# Patient Record
Sex: Female | Born: 1990 | Race: Black or African American | Hispanic: No | Marital: Single | Smoking: Never smoker
Health system: Southern US, Community
[De-identification: ages and names within clinical notes are randomized; demographics above are authoritative.]

## PROBLEM LIST (undated history)

## (undated) ENCOUNTER — Inpatient Hospital Stay (HOSPITAL_COMMUNITY): Payer: Self-pay

## (undated) DIAGNOSIS — R87629 Unspecified abnormal cytological findings in specimens from vagina: Secondary | ICD-10-CM

## (undated) DIAGNOSIS — A549 Gonococcal infection, unspecified: Secondary | ICD-10-CM

## (undated) HISTORY — PX: NO PAST SURGERIES: SHX2092

---

## 2012-07-07 ENCOUNTER — Encounter (HOSPITAL_BASED_OUTPATIENT_CLINIC_OR_DEPARTMENT_OTHER): Payer: Self-pay | Admitting: Emergency Medicine

## 2012-07-07 ENCOUNTER — Emergency Department (HOSPITAL_BASED_OUTPATIENT_CLINIC_OR_DEPARTMENT_OTHER)
Admission: EM | Admit: 2012-07-07 | Discharge: 2012-07-07 | Disposition: A | Discharge status: Home / Self Care | Payer: Self-pay | Attending: Emergency Medicine | Admitting: Emergency Medicine

## 2012-07-07 DIAGNOSIS — K2970 Gastritis, unspecified, without bleeding: Secondary | ICD-10-CM | POA: Insufficient documentation

## 2012-07-07 DIAGNOSIS — K2990 Gastroduodenitis, unspecified, without bleeding: Secondary | ICD-10-CM | POA: Insufficient documentation

## 2012-07-07 DIAGNOSIS — Z3202 Encounter for pregnancy test, result negative: Secondary | ICD-10-CM | POA: Insufficient documentation

## 2012-07-07 DIAGNOSIS — R1110 Vomiting, unspecified: Secondary | ICD-10-CM | POA: Insufficient documentation

## 2012-07-07 DIAGNOSIS — R197 Diarrhea, unspecified: Secondary | ICD-10-CM | POA: Insufficient documentation

## 2012-07-07 LAB — CBC WITH DIFFERENTIAL/PLATELET
Basophils Absolute: 0 10*3/uL (ref 0.0–0.1)
Eosinophils Absolute: 0.2 10*3/uL (ref 0.0–0.7)
Eosinophils Relative: 3 % (ref 0–5)
HCT: 37.2 % (ref 36.0–46.0)
Lymphocytes Relative: 24 % (ref 12–46)
MCH: 33.1 pg (ref 26.0–34.0)
MCHC: 35.5 g/dL (ref 30.0–36.0)
MCV: 93.2 fL (ref 78.0–100.0)
Monocytes Absolute: 0.7 10*3/uL (ref 0.1–1.0)
Platelets: 209 10*3/uL (ref 150–400)
RDW: 11.6 % (ref 11.5–15.5)
WBC: 8.1 10*3/uL (ref 4.0–10.5)

## 2012-07-07 LAB — COMPREHENSIVE METABOLIC PANEL
ALT: 8 U/L (ref 0–35)
AST: 15 U/L (ref 0–37)
Albumin: 4.3 g/dL (ref 3.5–5.2)
Alkaline Phosphatase: 79 U/L (ref 39–117)
BUN: 12 mg/dL (ref 6–23)
Chloride: 103 mEq/L (ref 96–112)
Potassium: 3.7 mEq/L (ref 3.5–5.1)
Sodium: 138 mEq/L (ref 135–145)
Total Bilirubin: 0.4 mg/dL (ref 0.3–1.2)
Total Protein: 8 g/dL (ref 6.0–8.3)

## 2012-07-07 LAB — URINALYSIS, ROUTINE W REFLEX MICROSCOPIC
Bilirubin Urine: NEGATIVE
Nitrite: NEGATIVE
Specific Gravity, Urine: 1.03 (ref 1.005–1.030)
Urobilinogen, UA: 1 mg/dL (ref 0.0–1.0)
pH: 8 (ref 5.0–8.0)

## 2012-07-07 LAB — PREGNANCY, URINE: Preg Test, Ur: NEGATIVE

## 2012-07-07 MED ORDER — ONDANSETRON HCL 4 MG/2ML IJ SOLN
4.0000 mg | Freq: Once | INTRAMUSCULAR | Status: AC
  Administered 2012-07-07: 4 mg via INTRAVENOUS
  Filled 2012-07-07: qty 2

## 2012-07-07 MED ORDER — ONDANSETRON 4 MG PO TBDP: 4.0000 mg | ORAL_TABLET | Freq: Three times a day (TID) | ORAL | Status: DC | PRN

## 2012-07-07 MED ORDER — SODIUM CHLORIDE 0.9 % IV BOLUS (SEPSIS)
1000.0000 mL | Freq: Once | INTRAVENOUS | Status: AC
  Administered 2012-07-07: 1000 mL via INTRAVENOUS

## 2012-07-07 NOTE — ED Notes (Signed)
Pt states she "passed out" last night. Today has been having vomiting and diarrhea. Pt states emesis this am with blood in it.

## 2012-07-07 NOTE — ED Provider Notes (Signed)
Medical screening examination/treatment/procedure(s) were performed by non-physician practitioner and as supervising physician I was immediately available for consultation/collaboration.   Modena Bellemare, MD 07/07/12 2336 

## 2012-07-07 NOTE — ED Provider Notes (Signed)
History     CSN: [id number]  Arrival date & time 07/07/12  1846   First MD Initiated Contact with Patient 07/07/12 2006      Chief Complaint  Patient presents with  . Emesis  . Diarrhea  . Abdominal Pain    (Consider location/radiation/quality/duration/timing/severity/associated sxs/prior treatment) HPI Comments: Vomiting and diarrhea since yesterday with multiple episodes:pt states that there was some red in her vomit today:pt states that she passed out last night, she decided to come in today because she vomited at work  Patient is a 22 y.o. female presenting with vomiting, diarrhea, and abdominal pain. The history is provided by the patient. No language interpreter was used.  Emesis Severity:  Moderate Duration:  2 days Timing:  Intermittent Quality:  Unable to specify Able to tolerate:  Liquids Progression:  Partially resolved Chronicity:  New Recent urination:  Decreased Relieved by:  Nothing Associated symptoms: abdominal pain and diarrhea   Associated symptoms: no fever   Diarrhea Associated symptoms: abdominal pain and vomiting   Abdominal Pain Associated symptoms include abdominal pain and vomiting.    History reviewed. No pertinent past medical history.  History reviewed. No pertinent past surgical history.  No family history on file.  History  Substance Use Topics  . Smoking status: Never Smoker   . Smokeless tobacco: Not on file  . Alcohol Use: No    OB History   Grav Para Term Preterm Abortions TAB SAB Ect Mult Living                  Review of Systems  Constitutional: Negative.   Respiratory: Negative.   Cardiovascular: Negative.   Gastrointestinal: Positive for vomiting, abdominal pain and diarrhea.    Allergies  Review of patient's allergies indicates no known allergies.  Home Medications  No current outpatient prescriptions on file.  BP 132/62  Pulse 93  Temp(Src) 98.8 F (37.1 C) (Oral)  Resp 18  SpO2 100%  LMP  06/23/2012  Physical Exam  Nursing note and vitals reviewed. Constitutional: She is oriented to person, place, and time. She appears well-developed and well-nourished.  HENT:  Head: Normocephalic and atraumatic.  Eyes: Conjunctivae are normal.  Neck: Normal range of motion. Neck supple.  Cardiovascular: Normal rate and regular rhythm.   Pulmonary/Chest: Effort normal and breath sounds normal.  Abdominal: Soft. Bowel sounds are normal. There is no tenderness.  Musculoskeletal: Normal range of motion.  Neurological: She is alert and oriented to person, place, and time.  Skin: Skin is warm and dry.    ED Course  Procedures (including critical care time)  Labs Reviewed  URINALYSIS, ROUTINE W REFLEX MICROSCOPIC - Abnormal; Notable for the following:    APPearance CLOUDY (*)    All other components within normal limits  PREGNANCY, URINE  COMPREHENSIVE METABOLIC PANEL  CBC WITH DIFFERENTIAL   No results found.   1. Gastritis       MDM  Likely viral:will send home with zofran:pt tolerating po here without any problem        Teressa Lower, NP 07/07/12 2335

## 2012-07-07 NOTE — ED Notes (Addendum)
PO. Fluid challenge initiated per request [place] PA

## 2012-07-19 ENCOUNTER — Emergency Department (HOSPITAL_COMMUNITY)
Admission: EM | Admit: 2012-07-19 | Discharge: 2012-07-19 | Disposition: A | Discharge status: Home / Self Care | Payer: Self-pay

## 2012-09-29 ENCOUNTER — Emergency Department (HOSPITAL_BASED_OUTPATIENT_CLINIC_OR_DEPARTMENT_OTHER): Payer: Medicaid Other

## 2012-09-29 ENCOUNTER — Encounter (HOSPITAL_BASED_OUTPATIENT_CLINIC_OR_DEPARTMENT_OTHER): Payer: Self-pay

## 2012-09-29 ENCOUNTER — Other Ambulatory Visit (HOSPITAL_BASED_OUTPATIENT_CLINIC_OR_DEPARTMENT_OTHER): Payer: Medicaid Other

## 2012-09-29 ENCOUNTER — Emergency Department (HOSPITAL_BASED_OUTPATIENT_CLINIC_OR_DEPARTMENT_OTHER)
Admission: EM | Admit: 2012-09-29 | Discharge: 2012-09-29 | Disposition: A | Discharge status: Home / Self Care | Payer: Medicaid Other | Attending: Emergency Medicine | Admitting: Emergency Medicine

## 2012-09-29 DIAGNOSIS — Z3490 Encounter for supervision of normal pregnancy, unspecified, unspecified trimester: Secondary | ICD-10-CM

## 2012-09-29 DIAGNOSIS — O209 Hemorrhage in early pregnancy, unspecified: Secondary | ICD-10-CM | POA: Insufficient documentation

## 2012-09-29 DIAGNOSIS — N898 Other specified noninflammatory disorders of vagina: Secondary | ICD-10-CM | POA: Insufficient documentation

## 2012-09-29 DIAGNOSIS — R110 Nausea: Secondary | ICD-10-CM | POA: Insufficient documentation

## 2012-09-29 DIAGNOSIS — K5900 Constipation, unspecified: Secondary | ICD-10-CM | POA: Insufficient documentation

## 2012-09-29 DIAGNOSIS — K6289 Other specified diseases of anus and rectum: Secondary | ICD-10-CM | POA: Insufficient documentation

## 2012-09-29 DIAGNOSIS — O9989 Other specified diseases and conditions complicating pregnancy, childbirth and the puerperium: Secondary | ICD-10-CM | POA: Insufficient documentation

## 2012-09-29 LAB — URINALYSIS, ROUTINE W REFLEX MICROSCOPIC
Bilirubin Urine: NEGATIVE
Ketones, ur: NEGATIVE mg/dL
Nitrite: NEGATIVE
Protein, ur: NEGATIVE mg/dL
pH: 8 (ref 5.0–8.0)

## 2012-09-29 LAB — URINE MICROSCOPIC-ADD ON

## 2012-09-29 MED ORDER — BISACODYL 10 MG RE SUPP
10.0000 mg | Freq: Once | RECTAL | Status: AC
  Administered 2012-09-29: 10 mg via RECTAL
  Filled 2012-09-29: qty 1

## 2012-09-29 NOTE — ED Provider Notes (Signed)
Scribed for No att. providers found, the patient was seen in room MHOTF/OTF. This chart was scribed by Lewanda Rife, ED scribe. Patient's care was started at 1655  CSN: [id number]     Arrival date & time 09/29/12  1635 History   First MD Initiated Contact with Patient 09/29/12 1654     Chief Complaint  Patient presents with  . Constipation   (Consider location/radiation/quality/duration/timing/severity/associated sxs/prior Treatment) HPI HPI Comments: Kelli Foster is a 22 y.o. female who presents to the Emergency Department complaining of constant moderate constipation onset 3 days. Reports associated nausea, vaginal discharge, vaginal bleeding, and moderate constant "butthole pain". Reports "butthole pain" is aggravated when sitting or lying on her back. Denies any alleviating factors. Denies associated emesis, abdominal pain, hemorrhoids, and rectal bleeding. Reports she is 2 months pregnant and has had an ultrasound. Reports taking milk of magnesia 2 am this morning with no relief of symptoms.   History reviewed. No pertinent past medical history. History reviewed. No pertinent past surgical history. No family history on file. History  Substance Use Topics  . Smoking status: Never Smoker   . Smokeless tobacco: Not on file  . Alcohol Use: No   OB History   Grav Para Term Preterm Abortions TAB SAB Ect Mult Living   1              Review of Systems  Constitutional: Negative for fever.  Gastrointestinal: Positive for nausea and constipation. Negative for vomiting, abdominal pain and anal bleeding.  Genitourinary: Negative.   A complete 10 system review of systems was obtained and all systems are negative except as noted in the HPI and PMH.    Allergies  Review of patient's allergies indicates no known allergies.  Home Medications   Current Outpatient Rx  Name  Route  Sig  Dispense  Refill  . ondansetron (ZOFRAN ODT) 4 MG disintegrating tablet   Oral   Take 1 tablet (4  mg total) by mouth every 8 (eight) hours as needed for nausea.   20 tablet   0    BP 123/77  Pulse 82  Temp(Src) 98.1 F (36.7 C) (Oral)  Resp 18  Ht 5\' 5"  (1.651 m)  Wt 144 lb (65.318 kg)  BMI 23.96 kg/m2  SpO2 99%  LMP 07/24/2012 Physical Exam  Nursing note and vitals reviewed. Constitutional: She is oriented to person, place, and time. She appears well-developed and well-nourished. No distress.  Uncomfortable appearing  HENT:  Head: Normocephalic and atraumatic.  Mouth/Throat: Oropharynx is clear and moist.  Eyes: Conjunctivae and EOM are normal.  Neck: Neck supple. No tracheal deviation present.  Cardiovascular: Normal rate, regular rhythm and normal heart sounds.   No murmur heard. Pulmonary/Chest: Effort normal. No respiratory distress.  Abdominal: Soft. There is tenderness.  Suprapubic tenderness, no RLQ tenderness  Genitourinary: Rectal exam shows no external hemorrhoid and no fissure.  No external hemorrhoids, no fissures, brown stool in the vault, diffuse rectal tenderness, no erythema or induration chaperon present   Musculoskeletal: Normal range of motion.  Neurological: She is alert and oriented to person, place, and time.  Skin: Skin is warm and dry.  Psychiatric: She has a normal mood and affect. Her behavior is normal.    ED Course  Procedures (including critical care time) Medications  bisacodyl (DULCOLAX) suppository 10 mg (10 mg Rectal Given 09/29/12 1727)    Labs Review Labs Reviewed  URINALYSIS, ROUTINE W REFLEX MICROSCOPIC - Abnormal; Notable for the following:    APPearance  CLOUDY (*)    Leukocytes, UA TRACE (*)    All other components within normal limits  URINE MICROSCOPIC-ADD ON - Abnormal; Notable for the following:    Squamous Epithelial / LPF FEW (*)    All other components within normal limits   Imaging Review US Ob Comp Less 14 Wks  09/29/2012   *RADIOLOGY REPORT*  Clinical Data: Constipation, positive pregnancy test, unsure dates   OBSTETRIC <14 WK ULTRASOUND  Technique:  Transabdominal ultrasound was performed for evaluation of the gestation as well as the maternal uterus and adnexal regions.  Comparison:  None.  Intrauterine gestational sac: Visualized/normal in shape. Yolk sac: Not visualized Embryo: Visualized Cardiac Activity: Visualized Heart Rate: 165 bpm  CRL:  31 mm  10 w  0 d        [place] EDC: 04/27/13  Maternal uterus/Adnexae: The ovaries are normal.  IMPRESSION: Single live intrauterine gestation measuring 10 weeks 0 days by today's exam which is concordant with assigned gestational age by LMP of 9 weeks 4 days, EDC by LMP 04/30/2013.  No acute abnormality.   Original Report Authenticated By: Christiana Pellant, M.D.    MDM   1. Constipation   2. Pregnancy    Rectal pain and constipation for the past 3 days. Patient states [redacted] weeks pregnant with confirmed IUP. Denies any vaginal bleeding or discharge. Has some suprapubic abdominal pain. No vomiting. No blood in stool.  UA negative for infection. Patient given glycerin suppository in the ED. She subsequently had a large bowel movement and feels better. Denies abdominal pain or back pain. IUP confirmed on [place].  Discussed with the patient that the safest thing to do in pregnancy for constipation is increase her fluid intake to 3 quarts a day. Increase fiber with fruits and vegetables. Over-the-counter fiber will be acceptable on a short-term basis. Followup with her OB this week.  I personally performed the services described in this documentation, which was scribed in my presence. The recorded information has been reviewed and is accurate.    Glynn Octave, MD 09/29/12 [phone]

## 2012-09-29 NOTE — ED Notes (Addendum)
C/o constipation x 3 days-states she has taken MOM-was advised by OB to take OTC stool softener-pt states neither has worked- [redacted] weeks pregnant

## 2012-11-13 LAB — OB RESULTS CONSOLE ABO/RH: RH Type: POSITIVE

## 2012-11-13 LAB — OB RESULTS CONSOLE GC/CHLAMYDIA
CHLAMYDIA, DNA PROBE: NEGATIVE
GC PROBE AMP, GENITAL: NEGATIVE

## 2012-11-13 LAB — OB RESULTS CONSOLE HEPATITIS B SURFACE ANTIGEN: Hepatitis B Surface Ag: NEGATIVE

## 2012-11-13 LAB — OB RESULTS CONSOLE ANTIBODY SCREEN: Antibody Screen: NEGATIVE

## 2012-11-13 LAB — OB RESULTS CONSOLE HIV ANTIBODY (ROUTINE TESTING): HIV: NONREACTIVE

## 2012-11-17 LAB — OB RESULTS CONSOLE HIV ANTIBODY (ROUTINE TESTING): HIV: NONREACTIVE

## 2012-11-17 LAB — OB RESULTS CONSOLE RPR: RPR: NONREACTIVE

## 2012-12-02 ENCOUNTER — Encounter (HOSPITAL_BASED_OUTPATIENT_CLINIC_OR_DEPARTMENT_OTHER): Payer: Self-pay | Admitting: Emergency Medicine

## 2012-12-02 ENCOUNTER — Emergency Department (HOSPITAL_BASED_OUTPATIENT_CLINIC_OR_DEPARTMENT_OTHER)
Admission: EM | Admit: 2012-12-02 | Discharge: 2012-12-02 | Disposition: A | Discharge status: Home / Self Care | Payer: Medicaid Other | Attending: Emergency Medicine | Admitting: Emergency Medicine

## 2012-12-02 DIAGNOSIS — O211 Hyperemesis gravidarum with metabolic disturbance: Secondary | ICD-10-CM | POA: Insufficient documentation

## 2012-12-02 DIAGNOSIS — O210 Mild hyperemesis gravidarum: Secondary | ICD-10-CM

## 2012-12-02 LAB — URINE MICROSCOPIC-ADD ON

## 2012-12-02 LAB — CBC WITH DIFFERENTIAL/PLATELET
Eosinophils Relative: 3 % (ref 0–5)
HCT: 33.8 % — ABNORMAL LOW (ref 36.0–46.0)
Hemoglobin: 11.9 g/dL — ABNORMAL LOW (ref 12.0–15.0)
Lymphocytes Relative: 14 % (ref 12–46)
MCHC: 35.2 g/dL (ref 30.0–36.0)
MCV: 94.7 fL (ref 78.0–100.0)
Monocytes Absolute: 0.7 10*3/uL (ref 0.1–1.0)
Monocytes Relative: 8 % (ref 3–12)
Neutro Abs: 6 10*3/uL (ref 1.7–7.7)
WBC: 8 10*3/uL (ref 4.0–10.5)

## 2012-12-02 LAB — COMPREHENSIVE METABOLIC PANEL
BUN: 9 mg/dL (ref 6–23)
CO2: 22 mEq/L (ref 19–32)
Chloride: 102 mEq/L (ref 96–112)
Creatinine, Ser: 0.6 mg/dL (ref 0.50–1.10)
GFR calc Af Amer: >90 mL/min (ref >90)
GFR calc non Af Amer: >90 mL/min (ref >90)
Total Bilirubin: 0.8 mg/dL (ref 0.3–1.2)

## 2012-12-02 LAB — URINALYSIS, ROUTINE W REFLEX MICROSCOPIC
Hgb urine dipstick: NEGATIVE
Nitrite: NEGATIVE
Protein, ur: 30 mg/dL — AB
Urobilinogen, UA: 0.2 mg/dL (ref 0.0–1.0)

## 2012-12-02 MED ORDER — ONDANSETRON 4 MG PO TBDP: ORAL_TABLET | ORAL | Status: DC

## 2012-12-02 MED ORDER — SODIUM CHLORIDE 0.9 % IV BOLUS (SEPSIS)
1000.0000 mL | Freq: Once | INTRAVENOUS | Status: AC
  Administered 2012-12-02 (×2): 1000 mL via INTRAVENOUS

## 2012-12-02 MED ORDER — ONDANSETRON HCL 4 MG/2ML IJ SOLN
4.0000 mg | Freq: Once | INTRAMUSCULAR | Status: AC
  Administered 2012-12-02: 4 mg via INTRAVENOUS
  Filled 2012-12-02: qty 2

## 2012-12-02 NOTE — ED Notes (Signed)
No vomiting after ice chips. Given sprite po.

## 2012-12-02 NOTE — ED Notes (Signed)
Patient states she has hyperemesis of pregnancy and has had nausea and vomiting for the last five months.  States Dr. Vela Prose is her OBGYN and she was last seen on 11/27/12.  States yesterday the nausea became worse and she has not been able to take in the zofran.  Dr. Vela Prose called in phenergan suppository which is unable to keep in.

## 2012-12-02 NOTE — ED Provider Notes (Signed)
CSN: [id number]     Arrival date & time 12/02/12  0932 History   First MD Initiated Contact with Patient 12/02/12 1002     Chief Complaint  Patient presents with  . Emesis   (Consider location/radiation/quality/duration/timing/severity/associated sxs/prior Treatment) HPI Comments: Pt states she tried to use phenergan suppository but that she "couldn't keep it in" longer than a few mins.   Patient is a 22 y.o. female presenting with vomiting. The history is provided by the patient. No language interpreter was used.  Emesis Severity:  Severe Duration:  3 months Timing:  Constant Number of daily episodes:  >10 Quality:  Stomach contents Progression:  Worsening Chronicity:  Chronic Recent urination:  Normal Relieved by:  Nothing Worsened by:  Food smell and liquids Ineffective treatments:  Antiemetics Associated symptoms: no abdominal pain, no arthralgias, no chills, no cough, no diarrhea, no fever, no headaches and no sore throat   Risk factors: pregnant now   Risk factors: no sick contacts, no suspect food intake and no travel to endemic areas     History reviewed. No pertinent past medical history. History reviewed. No pertinent past surgical history. No family history on file. History  Substance Use Topics  . Smoking status: Never Smoker   . Smokeless tobacco: Not on file  . Alcohol Use: No   OB History   Grav Para Term Preterm Abortions TAB SAB Ect Mult Living   1              Review of Systems  Constitutional: Negative for fever, chills, diaphoresis, activity change, appetite change and fatigue.  HENT: Negative for congestion, facial swelling, rhinorrhea and sore throat.   Eyes: Negative for photophobia and discharge.  Respiratory: Negative for cough, chest tightness and shortness of breath.   Cardiovascular: Negative for chest pain, palpitations and leg swelling.  Gastrointestinal: Positive for vomiting. Negative for nausea, abdominal pain and diarrhea.   Endocrine: Negative for polydipsia and polyuria.  Genitourinary: Negative for dysuria, urgency, frequency, vaginal bleeding, vaginal discharge, difficulty urinating, vaginal pain and pelvic pain.  Musculoskeletal: Negative for arthralgias, back pain, neck pain and neck stiffness.  Skin: Negative for color change and wound.  Allergic/Immunologic: Negative for immunocompromised state.  Neurological: Negative for facial asymmetry, weakness, numbness and headaches.  Hematological: Does not bruise/bleed easily.  Psychiatric/Behavioral: Negative for confusion and agitation.    Allergies  Review of patient's allergies indicates no known allergies.  Home Medications   Current Outpatient Rx  Name  Route  Sig  Dispense  Refill  . ondansetron (ZOFRAN ODT) 4 MG disintegrating tablet   Oral   Take 1 tablet (4 mg total) by mouth every 8 (eight) hours as needed for nausea.   20 tablet   0   . ondansetron (ZOFRAN ODT) 4 MG disintegrating tablet      4mg  ODT q4 hours prn nausea/vomit   30 tablet   0    BP 91/55  Pulse 89  Temp(Src) 98.6 F (37 C) (Oral)  Resp 20  SpO2 100%  LMP 08/19/2012 Physical Exam  Constitutional: She is oriented to person, place, and time. She appears well-developed and well-nourished. No distress.  HENT:  Head: Normocephalic and atraumatic.  Mouth/Throat: No oropharyngeal exudate.  Eyes: Pupils are equal, round, and reactive to light.  Neck: Normal range of motion. Neck supple.  Cardiovascular: Normal rate, regular rhythm and normal heart sounds.  Exam reveals no gallop and no friction rub.   No murmur heard. Pulmonary/Chest: Effort normal and breath  sounds normal. No respiratory distress. She has no wheezes. She has no rales.  Abdominal: Soft. Bowel sounds are normal. She exhibits no distension and no mass. There is no tenderness. There is no rebound and no guarding.  Musculoskeletal: Normal range of motion. She exhibits no edema and no tenderness.   Neurological: She is alert and oriented to person, place, and time.  Skin: Skin is warm and dry.  Psychiatric: She has a normal mood and affect.    ED Course  Procedures (including critical care time) Labs Review Labs Reviewed  CBC WITH DIFFERENTIAL - Abnormal; Notable for the following:    RBC 3.57 (*)    Hemoglobin 11.9 (*)    HCT 33.8 (*)    All other components within normal limits  URINALYSIS, ROUTINE W REFLEX MICROSCOPIC - Abnormal; Notable for the following:    Color, Urine AMBER (*)    APPearance CLOUDY (*)    Specific Gravity, Urine 1.033 (*)    Bilirubin Urine SMALL (*)    Ketones, ur >80 (*)    Protein, ur 30 (*)    Leukocytes, UA SMALL (*)    All other components within normal limits  URINE MICROSCOPIC-ADD ON - Abnormal; Notable for the following:    Squamous Epithelial / LPF MANY (*)    Bacteria, UA MANY (*)    All other components within normal limits  URINE CULTURE  COMPREHENSIVE METABOLIC PANEL   Imaging Review No results found.  EKG Interpretation   None       MDM   1. Hyperemesis of pregnancy    Pt is a 22 y.o. female G1 with Pmhx as above including hyperemesis of pregancywho presents with 3 days of worsening n/v, inability to tolerate PO, or use ODT zofran, or PR phenergan.  VSS, pt in NAD, with moist mucus membranes upon arrival.  IVF, IV zofran given, then then able to tolerate PO.  CMP does not show signs of dehydration or e-lyte disturbance.  Urinue contaminated, denied urinary symptoms. I believe she is safe for d/c home. Hyperemesis diet instructions given. Return precautions given for new or worsening symptoms, she can f/u with her OB         Shanna Cisco, MD 12/03/12 [phone]

## 2012-12-02 NOTE — ED Notes (Signed)
Spoke with Joni Reining, RN Rapid response Nurse at [facility].  Stated that we do not need to monitor the baby on the OB monitor due to the baby's gestational age of [redacted] weeks.  Reported to obtain a fetal heart rate and record.

## 2012-12-02 NOTE — ED Notes (Signed)
No vomiting after po sprite.

## 2012-12-02 NOTE — ED Notes (Signed)
Pt states she need a rx for Zofran

## 2012-12-03 LAB — URINE CULTURE

## 2013-02-05 NOTE — L&D Delivery Note (Signed)
Pt was admitted in early labor. She had AROM with clear fluid. She rapidly progressed to C/C/+2. She pushed for 45 minutes and had a SVD of one live viable female infant in the ROA position over a second degree midline tear. Placenta -S/I. Tear closed with 3-0 chromic. Baby to NBN. EBL-400cc.

## 2013-02-18 ENCOUNTER — Inpatient Hospital Stay (HOSPITAL_COMMUNITY)
Admission: AD | Admit: 2013-02-18 | Discharge: 2013-02-19 | Disposition: A | Discharge status: Home / Self Care | Payer: Medicaid Other | Source: Ambulatory Visit | Attending: Obstetrics and Gynecology | Admitting: Obstetrics and Gynecology

## 2013-02-18 ENCOUNTER — Encounter (HOSPITAL_COMMUNITY): Payer: Self-pay | Admitting: *Deleted

## 2013-02-18 DIAGNOSIS — O9989 Other specified diseases and conditions complicating pregnancy, childbirth and the puerperium: Principal | ICD-10-CM

## 2013-02-18 DIAGNOSIS — N898 Other specified noninflammatory disorders of vagina: Secondary | ICD-10-CM | POA: Insufficient documentation

## 2013-02-18 DIAGNOSIS — O99891 Other specified diseases and conditions complicating pregnancy: Secondary | ICD-10-CM | POA: Insufficient documentation

## 2013-02-18 DIAGNOSIS — O26893 Other specified pregnancy related conditions, third trimester: Secondary | ICD-10-CM

## 2013-02-18 NOTE — MAU Note (Signed)
Pt G1 at 30.1wks, leaking clear fluid since 0900 and having mild lower abd cramping.

## 2013-02-19 ENCOUNTER — Encounter (HOSPITAL_COMMUNITY): Payer: Self-pay | Admitting: *Deleted

## 2013-02-19 DIAGNOSIS — O9989 Other specified diseases and conditions complicating pregnancy, childbirth and the puerperium: Secondary | ICD-10-CM

## 2013-02-19 DIAGNOSIS — N898 Other specified noninflammatory disorders of vagina: Secondary | ICD-10-CM

## 2013-02-19 LAB — URINALYSIS, ROUTINE W REFLEX MICROSCOPIC
BILIRUBIN URINE: NEGATIVE
Glucose, UA: NEGATIVE mg/dL
Ketones, ur: NEGATIVE mg/dL
Leukocytes, UA: NEGATIVE
NITRITE: NEGATIVE
PH: 5.5 (ref 5.0–8.0)
Protein, ur: NEGATIVE mg/dL
SPECIFIC GRAVITY, URINE: 1.02 (ref 1.005–1.030)
UROBILINOGEN UA: 0.2 mg/dL (ref 0.0–1.0)

## 2013-02-19 LAB — WET PREP, GENITAL
Trich, Wet Prep: NONE SEEN
YEAST WET PREP: NONE SEEN

## 2013-02-19 LAB — URINE MICROSCOPIC-ADD ON

## 2013-02-19 NOTE — Discharge Instructions (Signed)

## 2013-02-19 NOTE — MAU Provider Note (Signed)
Chief Complaint:  Rupture of Membranes  First Provider Initiated Contact with Patient 02/19/13 0120     HPI: Kelli Foster is a 23 y.o. G1P0 at [redacted]w[redacted]d who presents to maternity admissions reporting possible leaking of fluid. States her underpants felt damp the night of 02/17/2013. Yesterday, 02/18/2013 she felt something leaking from her vagina and there was enough to soak slightly through her shorts. She was unable to tell if what leaked out was clear or white. No leaking since then, just a little bit of discharge. Rare, mild cramping throughout the day. Denies fever, chills, abdominal pain, vaginal bleeding. Slight increase in urinary frequency, but no dysuria, urgency, hematuria or flank pain. Good fetal movement.   Past Medical History: Past Medical History  Diagnosis Date  . Medical history non-contributory     Past obstetric history: OB History  Gravida Para Term Preterm AB SAB TAB Ectopic Multiple Living  1             # Outcome Date GA Lbr Len/2nd Weight Sex Delivery Anes PTL Lv  1 CUR               Past Surgical History: Past Surgical History  Procedure Laterality Date  . No past surgeries       Family History: History reviewed. No pertinent family history.  Social History: History  Substance Use Topics  . Smoking status: Never Smoker   . Smokeless tobacco: Not on file  . Alcohol Use: No    Allergies: No Known Allergies  Meds:  Prescriptions prior to admission  Medication Sig Dispense Refill  . ondansetron (ZOFRAN ODT) 4 MG disintegrating tablet Take 1 tablet (4 mg total) by mouth every 8 (eight) hours as needed for nausea.  20 tablet  0  . Prenatal Vit-Fe Fumarate-FA (MULTIVITAMIN-PRENATAL) 27-0.8 MG TABS tablet Take 1 tablet by mouth daily at 12 noon.      . ondansetron (ZOFRAN ODT) 4 MG disintegrating tablet 4mg  ODT q4 hours prn nausea/vomit  30 tablet  0    ROS: Pertinent findings in history of present illness.  Physical Exam  Blood pressure 116/70,  pulse 86, temperature 98.2 F (36.8 C), temperature source Oral, resp. rate 18, height 5\' 6"  (1.676 m), weight 73.483 kg (162 lb), last menstrual period 08/19/2012. GENERAL: Well-developed, well-nourished female in no acute distress.  HEENT: normocephalic HEART: normal rate RESP: normal effort ABDOMEN: Soft, non-tender, gravid appropriate for gestational age. No CVA tenderness. EXTREMITIES: Nontender, no edema NEURO: alert and oriented SPECULUM EXAM: NEFG, scant, white, odorless discharge, negative pooling. no blood, cervix clean Cervix closed/long/-3, posterior, firm Exam by Dorathy Kinsman, CNM  FHT:  Baseline 120 , moderate variability, accelerations present, no decelerations Contractions: rare, painless   Labs: Results for orders placed during the hospital encounter of 02/18/13 (from the past 24 hour(s))  URINALYSIS, ROUTINE W REFLEX MICROSCOPIC     Status: Abnormal   Collection Time    02/19/13 12:05 AM      Result Value Range   Color, Urine YELLOW  YELLOW   APPearance CLEAR  CLEAR   Specific Gravity, Urine 1.020  1.005 - 1.030   pH 5.5  5.0 - 8.0   Glucose, UA NEGATIVE  NEGATIVE mg/dL   Hgb urine dipstick TRACE (*) NEGATIVE   Bilirubin Urine NEGATIVE  NEGATIVE   Ketones, ur NEGATIVE  NEGATIVE mg/dL   Protein, ur NEGATIVE  NEGATIVE mg/dL   Urobilinogen, UA 0.2  0.0 - 1.0 mg/dL   Nitrite NEGATIVE  NEGATIVE  Leukocytes, UA NEGATIVE  NEGATIVE  URINE MICROSCOPIC-ADD ON     Status: Abnormal   Collection Time    02/19/13 12:05 AM      Result Value Range   Squamous Epithelial / LPF FEW (*) RARE   WBC, UA 0-2  <3 WBC/hpf   RBC / HPF 0-2  <3 RBC/hpf   Bacteria, UA RARE  RARE   Crystals CA OXALATE CRYSTALS (*) NEGATIVE   Urine-Other MUCOUS PRESENT    WET PREP, GENITAL     Status: Abnormal   Collection Time    02/19/13  1:20 AM      Result Value Range   Yeast Wet Prep HPF POC NONE SEEN  NONE SEEN   Trich, Wet Prep NONE SEEN  NONE SEEN   Clue Cells Wet Prep HPF POC FEW (*)  NONE SEEN   WBC, Wet Prep HPF POC FEW (*) NONE SEEN   Negative fern  Imaging:  No results found.  MAU Course: Status patient's leaking episodes, exam and test results with Dr. Henderson CloudHorvath. No new orders. May discharge home.  Assessment: 1. Vaginal discharge in pregnancy in third trimester    Plan: Discharge home in stable condition. Preterm labor precautions and fetal kick counts Follow-up Information   Follow [facility] with [facility] (as scheduled or as needed if symptoms worsen)    Contact information:   [address] [place] [id number] [phone]      [facility]. (As needed if symptoms worsen)    Contact information:   [address] [id number] [place] [id number] [phone]       Medication List         multivitamin-prenatal 27-0.8 MG Tabs tablet  Take 1 tablet by mouth daily at 12 noon.     ondansetron 4 MG disintegrating tablet  Commonly known as:  ZOFRAN ODT  Take 1 tablet (4 mg total) by mouth every 8 (eight) hours as needed for nausea.     ondansetron 4 MG disintegrating tablet  Commonly known as:  ZOFRAN ODT  4mg  ODT q4 hours prn nausea/vomit       Dorathy KinsmanVirginia Alyze Lauf, CNM 02/19/2013 3:07 AM

## 2013-02-20 LAB — GC/CHLAMYDIA PROBE AMP
CT PROBE, AMP APTIMA: NEGATIVE
GC PROBE AMP APTIMA: NEGATIVE

## 2013-03-28 ENCOUNTER — Inpatient Hospital Stay (HOSPITAL_COMMUNITY)
Admission: AD | Admit: 2013-03-28 | Discharge: 2013-03-29 | Disposition: A | Discharge status: Home / Self Care | Payer: Medicaid Other | Source: Ambulatory Visit | Attending: Obstetrics and Gynecology | Admitting: Obstetrics and Gynecology

## 2013-03-28 ENCOUNTER — Encounter (HOSPITAL_COMMUNITY): Payer: Self-pay | Admitting: *Deleted

## 2013-03-28 DIAGNOSIS — O219 Vomiting of pregnancy, unspecified: Secondary | ICD-10-CM

## 2013-03-28 DIAGNOSIS — R197 Diarrhea, unspecified: Secondary | ICD-10-CM | POA: Insufficient documentation

## 2013-03-28 DIAGNOSIS — O212 Late vomiting of pregnancy: Secondary | ICD-10-CM | POA: Insufficient documentation

## 2013-03-28 LAB — URINALYSIS, ROUTINE W REFLEX MICROSCOPIC
Bilirubin Urine: NEGATIVE
Glucose, UA: NEGATIVE mg/dL
Hgb urine dipstick: NEGATIVE
Ketones, ur: >80 mg/dL — AB
Leukocytes, UA: NEGATIVE
NITRITE: NEGATIVE
PH: 6 (ref 5.0–8.0)
Protein, ur: NEGATIVE mg/dL
Urobilinogen, UA: 0.2 mg/dL (ref 0.0–1.0)

## 2013-03-28 MED ORDER — LACTATED RINGERS IV SOLN
INTRAVENOUS | Status: DC
  Administered 2013-03-28 (×2): via INTRAVENOUS
  Filled 2013-03-28 (×21): qty 1000

## 2013-03-28 MED ORDER — PROMETHAZINE HCL 25 MG/ML IJ SOLN
25.0000 mg | Freq: Once | INTRAMUSCULAR | Status: AC
  Administered 2013-03-28: 25 mg via INTRAVENOUS
  Filled 2013-03-28: qty 1

## 2013-03-28 MED ORDER — FAMOTIDINE IN NACL 20-0.9 MG/50ML-% IV SOLN
20.0000 mg | Freq: Once | INTRAVENOUS | Status: AC
  Administered 2013-03-28: 20 mg via INTRAVENOUS
  Filled 2013-03-28: qty 50

## 2013-03-28 NOTE — MAU Note (Signed)
N/v/d today. Diarrhea x 2 but vomiting all day.

## 2013-03-28 NOTE — MAU Provider Note (Signed)
History     CSN: [id number]  Arrival date and time: 03/28/13 2042   First Provider Initiated Contact with Patient 03/28/13 2118      Chief Complaint  Patient presents with  . Emesis During Pregnancy  . Diarrhea   HPI Comments: Kelli Foster 22 y.o. G1P0 presents to MAU for N/V/D for one day. Started this morning and she does not have a reason. Baby feels fine with good Fetal movement.   Diarrhea  Associated symptoms include vomiting. Pertinent negatives include no abdominal pain, chills or fever.      Past Medical History  Diagnosis Date  . Medical history non-contributory     Past Surgical History  Procedure Laterality Date  . No past surgeries      History reviewed. No pertinent family history.  History  Substance Use Topics  . Smoking status: Never Smoker   . Smokeless tobacco: Not on file  . Alcohol Use: No    Allergies: No Known Allergies  Prescriptions prior to admission  Medication Sig Dispense Refill  . ondansetron (ZOFRAN ODT) 4 MG disintegrating tablet Take 1 tablet (4 mg total) by mouth every 8 (eight) hours as needed for nausea.  20 tablet  0  . ondansetron (ZOFRAN ODT) 4 MG disintegrating tablet 4mg  ODT q4 hours prn nausea/vomit  30 tablet  0  . Prenatal Vit-Fe Fumarate-FA (MULTIVITAMIN-PRENATAL) 27-0.8 MG TABS tablet Take 1 tablet by mouth daily at 12 noon.        Review of Systems  Constitutional: Negative for fever and chills.  HENT: Negative.   Eyes: Negative.   Respiratory: Negative.   Cardiovascular: Negative.   Gastrointestinal: Positive for nausea, vomiting and diarrhea. Negative for abdominal pain.  Genitourinary: Negative.   Musculoskeletal: Negative.   Skin: Negative.   Neurological: Negative.   Psychiatric/Behavioral: Negative.    Physical Exam   Blood pressure 121/62, pulse 97, temperature 98.4 F (36.9 C), resp. rate 20, height 5\' 5"  (1.651 m), weight 78.382 kg (172 lb 12.8 oz), last menstrual period  08/19/2012.  Physical Exam  Constitutional: She is oriented to person, place, and time. She appears well-developed and well-nourished. She appears distressed.  HENT:  Head: Normocephalic and atraumatic.  Eyes: Pupils are equal, round, and reactive to light.  Cardiovascular: Normal rate, regular rhythm and normal heart sounds.   Respiratory: Effort normal and breath sounds normal.  GI: Soft. Bowel sounds are normal. She exhibits no distension and no mass. There is no tenderness. There is no rebound and no guarding.  Musculoskeletal: Normal range of motion.  Neurological: She is alert and oriented to person, place, and time.  Skin: Skin is warm and dry.  Psychiatric: She has a normal mood and affect. Her behavior is normal. Judgment normal.   Results for orders placed during the hospital encounter of 03/28/13 (from the past 24 hour(s))  URINALYSIS, ROUTINE W REFLEX MICROSCOPIC     Status: Abnormal   Collection Time    03/28/13  8:45 PM      Result Value Ref Range   Color, Urine YELLOW  YELLOW   APPearance CLEAR  CLEAR   Specific Gravity, Urine >1.030 (*) 1.005 - 1.030   pH 6.0  5.0 - 8.0   Glucose, UA NEGATIVE  NEGATIVE mg/dL   Hgb urine dipstick NEGATIVE  NEGATIVE   Bilirubin Urine NEGATIVE  NEGATIVE   Ketones, ur >80 (*) NEGATIVE mg/dL   Protein, ur NEGATIVE  NEGATIVE mg/dL   Urobilinogen, UA 0.2  0.0 - 1.0 mg/dL  Nitrite NEGATIVE  NEGATIVE   Leukocytes, UA NEGATIVE  NEGATIVE    RN assessed cervix: closed  MAU Course  Procedures  MDM  IVLR x 2 Phenergan 25 mg IV Pepcid 20 mg IVPB Call to Dr Fredia SorrowK Ross with patient status/ agreed with care plan   Assessment and Plan   A: Nausea, vomiting, diarrhea in pregnancy  P: LR 2 liters Phenergan here and to take home Pepcid 20 mg IVPB Stay hydrated, rest [facility] as needed  Carolynn ServeBarefoot, Laela Deviney Miller 03/28/2013, 9:29 PM

## 2013-03-29 MED ORDER — PROMETHAZINE HCL 25 MG PO TABS: 25.0000 mg | ORAL_TABLET | Freq: Four times a day (QID) | ORAL | Status: DC | PRN

## 2013-03-29 NOTE — Discharge Instructions (Signed)
Morning Sickness °Morning sickness is when you feel sick to your stomach (nauseous) during pregnancy. This nauseous feeling may or may not come with vomiting. It often occurs in the morning but can be a problem any time of day. Morning sickness is most common during the first trimester, but it may continue throughout pregnancy. While morning sickness is unpleasant, it is usually harmless unless you develop severe and continual vomiting (hyperemesis gravidarum). This condition requires more intense treatment.  °CAUSES  °The cause of morning sickness is not completely known but seems to be related to normal hormonal changes that occur in pregnancy. °RISK FACTORS °You are at greater risk if you: °· Experienced nausea or vomiting before your pregnancy. °· Had morning sickness during a previous pregnancy. °· Are pregnant with more than one baby, such as twins. °TREATMENT  °Do not use any medicines (prescription, over-the-counter, or herbal) for morning sickness without first talking to your health care provider. Your health care provider may prescribe or recommend: °· Vitamin B6 supplements. °· Anti-nausea medicines. °· The herbal medicine ginger. °HOME CARE INSTRUCTIONS  °· Only take over-the-counter or prescription medicines as directed by your health care provider. °· Taking multivitamins before getting pregnant can prevent or decrease the severity of morning sickness in most women.   °· Eat a piece of dry toast or unsalted crackers before getting out of bed in the morning.   °· Eat five or six small meals a day.   °· Eat dry and bland foods (rice, baked potato ). Foods high in carbohydrates are often helpful.  °· Do not drink liquids with your meals. Drink liquids between meals.   °· Avoid greasy, fatty, and spicy foods.   °· Get someone to cook for you if the smell of any food causes nausea and vomiting.   °· If you feel nauseous after taking prenatal vitamins, take the vitamins at night or with a snack.  °· Snack  on protein foods (nuts, yogurt, cheese) between meals if you are hungry.   °· Eat unsweetened gelatins for desserts.   °· Wearing an acupressure wristband (worn for sea sickness) may be helpful.   °· Acupuncture may be helpful.   °· Do not smoke.   °· Get a humidifier to keep the air in your house free of odors.   °· Get plenty of fresh air. °SEEK MEDICAL CARE IF:  °· Your home remedies are not working, and you need medicine. °· You feel dizzy or lightheaded. °· You are losing weight. °SEEK IMMEDIATE MEDICAL CARE IF:  °· You have persistent and uncontrolled nausea and vomiting. °· You pass out (faint). °Document Released: 03/15/2006 Document Revised: 09/24/2012 Document Reviewed: 07/09/2012 °ExitCare® Patient Information ©2014 [employer]. °Diet for Diarrhea, Adult °Frequent, runny stools (diarrhea) may be caused or worsened by food or drink. Diarrhea may be relieved by changing your diet. Since diarrhea can last up to 7 days, it is easy for you to lose too much fluid from the body and become dehydrated. Fluids that are lost need to be replaced. Along with a modified diet, make sure you drink enough fluids to keep your urine clear or pale yellow. °DIET INSTRUCTIONS °· Ensure adequate fluid intake (hydration): have 1 cup (8 oz) of fluid for each diarrhea episode. Avoid fluids that contain simple sugars or sports drinks, fruit juices, whole milk products, and sodas. Your urine should be clear or pale yellow if you are drinking enough fluids. Hydrate with an oral rehydration solution that you can purchase at pharmacies, retail stores, and online. You can prepare an oral rehydration solution at   home by mixing the following ingredients together: °·   tsp table salt. °· ¾ tsp baking soda. °·  tsp salt substitute containing potassium chloride. °· 1  tablespoons sugar. °· 1 L (34 oz) of water. °· Certain foods and beverages may increase the speed at which food moves through the gastrointestinal (GI) tract. These foods and  beverages should be avoided and include: °· Caffeinated and alcoholic beverages. °· High-fiber foods, such as raw fruits and vegetables, nuts, seeds, and whole grain breads and cereals. °· Foods and beverages sweetened with sugar alcohols, such as xylitol, sorbitol, and mannitol. °· Some foods may be well tolerated and may help thicken stool including: °· Starchy foods, such as rice, toast, pasta, low-sugar cereal, oatmeal, grits, baked potatoes, crackers, and bagels.   °· Bananas.   °· Applesauce. °· Add probiotic-rich foods to help increase healthy bacteria in the GI tract, such as yogurt and fermented milk products. °RECOMMENDED FOODS AND BEVERAGES °Starches °Choose foods with less than 2 g of fiber per serving. °· Recommended:  White, French, and pita breads, plain rolls, buns, bagels. Plain muffins, matzo. Soda, saltine, or graham crackers. Pretzels, melba toast, zwieback. Cooked cereals made with water: cornmeal, farina, cream cereals. Dry cereals: refined corn, wheat, rice. Potatoes prepared any way without skins, refined macaroni, spaghetti, noodles, refined rice. °· Avoid:  Bread, rolls, or crackers made with whole wheat, multi-grains, rye, bran seeds, nuts, or coconut. Corn tortillas or taco shells. Cereals containing whole grains, multi-grains, bran, coconut, nuts, raisins. Cooked or dry oatmeal. Coarse wheat cereals, granola. Cereals advertised as "high-fiber." Potato skins. Whole grain pasta, wild or brown rice. Popcorn. Sweet potatoes, yams. Sweet rolls, doughnuts, waffles, pancakes, sweet breads. °Vegetables °· Recommended: Strained tomato and vegetable juices. Most well-cooked and canned vegetables without seeds. Fresh: Tender lettuce, cucumber without the skin, cabbage, spinach, bean sprouts. °· Avoid: Fresh, cooked, or canned: Artichokes, baked beans, beet greens, broccoli, Brussels sprouts, corn, kale, legumes, peas, sweet potatoes. Cooked: Green or red cabbage, spinach. Avoid large servings of  any vegetables because vegetables shrink when cooked, and they contain more fiber per serving than fresh vegetables. °Fruit °· Recommended: Cooked or canned: Apricots, applesauce, cantaloupe, cherries, fruit cocktail, grapefruit, grapes, kiwi, mandarin oranges, peaches, pears, plums, watermelon. Fresh: Apples without skin, ripe banana, grapes, cantaloupe, cherries, grapefruit, peaches, oranges, plums. Keep servings limited to ½ cup or 1 piece. °· Avoid: Fresh: Apples with skin, apricots, mangoes, pears, raspberries, strawberries. Prune juice, stewed or dried prunes. Dried fruits, raisins, dates. Large servings of all fresh fruits. °Protein °· Recommended: Ground or well-cooked tender beef, ham, veal, lamb, pork, or poultry. Eggs. Fish, oysters, shrimp, lobster, other seafoods. Liver, organ meats. °· Avoid: Tough, fibrous meats with gristle. Peanut butter, smooth or chunky. Cheese, nuts, seeds, legumes, dried peas, beans, lentils. °Dairy °· Recommended: Yogurt, lactose-free milk, kefir, drinkable yogurt, buttermilk, soy milk, or plain hard cheese. °· Avoid: Milk, chocolate milk, beverages made with milk, such as milkshakes. °Soups °· Recommended: Bouillon, broth, or soups made from allowed foods. Any strained soup. °· Avoid: Soups made from vegetables that are not allowed, cream or milk-based soups. °Desserts and Sweets °· Recommended: Sugar-free gelatin, sugar-free frozen ice pops made without sugar alcohol. °· Avoid: Plain cakes and cookies, pie made with fruit, pudding, custard, cream pie. Gelatin, fruit, ice, sherbet, frozen ice pops. Ice cream, ice milk without nuts. Plain hard candy, honey, jelly, molasses, syrup, sugar, chocolate syrup, gumdrops, marshmallows. °Fats and Oils °· Recommended: Limit fats to less than 8 tsp per   day. °· Avoid: Seeds, nuts, olives, avocados. Margarine, butter, cream, mayonnaise, salad oils, plain salad dressings. Plain gravy, crisp bacon without rind. °Beverages °· Recommended:  Water, decaffeinated teas, oral rehydration solutions, sugar-free beverages not sweetened with sugar alcohols. °· Avoid: Fruit juices, caffeinated beverages (coffee, tea, soda), alcohol, sports drinks, or lemon-lime soda. °Condiments °· Recommended: Ketchup, mustard, horseradish, vinegar, cocoa powder. Spices in moderation: allspice, basil, bay leaves, celery powder or leaves, cinnamon, cumin powder, curry powder, ginger, mace, marjoram, onion or garlic powder, oregano, paprika, parsley flakes, ground pepper, rosemary, sage, savory, tarragon, thyme, turmeric. °· Avoid: Coconut, honey. °Document Released: 04/14/2003 Document Revised: 10/17/2011 Document Reviewed: 06/08/2011 °ExitCare® Patient Information ©2014 [employer]. ° °

## 2013-03-30 ENCOUNTER — Other Ambulatory Visit: Payer: Self-pay

## 2013-03-30 LAB — OB RESULTS CONSOLE GBS: GBS: NEGATIVE

## 2013-04-28 ENCOUNTER — Inpatient Hospital Stay (HOSPITAL_COMMUNITY): Payer: Medicaid Other | Admitting: Anesthesiology

## 2013-04-28 ENCOUNTER — Inpatient Hospital Stay (HOSPITAL_COMMUNITY): Payer: Medicaid Other

## 2013-04-28 ENCOUNTER — Encounter (HOSPITAL_COMMUNITY): Payer: Self-pay | Admitting: *Deleted

## 2013-04-28 ENCOUNTER — Encounter (HOSPITAL_COMMUNITY): Payer: Medicaid Other | Admitting: Anesthesiology

## 2013-04-28 ENCOUNTER — Inpatient Hospital Stay (HOSPITAL_COMMUNITY)
Admission: AD | Admit: 2013-04-28 | Discharge: 2013-04-30 | DRG: 775 | Disposition: A | Discharge status: Home / Self Care | Payer: Medicaid Other | Source: Ambulatory Visit | Attending: Obstetrics and Gynecology | Admitting: Obstetrics and Gynecology

## 2013-04-28 DIAGNOSIS — Z3480 Encounter for supervision of other normal pregnancy, unspecified trimester: Secondary | ICD-10-CM

## 2013-04-28 HISTORY — DX: Gonococcal infection, unspecified: A54.9

## 2013-04-28 HISTORY — DX: Unspecified abnormal cytological findings in specimens from vagina: R87.629

## 2013-04-28 LAB — CBC
HEMATOCRIT: 32.7 % — AB (ref 36.0–46.0)
Hemoglobin: 11.6 g/dL — ABNORMAL LOW (ref 12.0–15.0)
MCH: 33.4 pg (ref 26.0–34.0)
MCHC: 35.5 g/dL (ref 30.0–36.0)
MCV: 94.2 fL (ref 78.0–100.0)
Platelets: 216 10*3/uL (ref 150–400)
RBC: 3.47 MIL/uL — ABNORMAL LOW (ref 3.87–5.11)
RDW: 13 % (ref 11.5–15.5)
WBC: 10.7 10*3/uL — AB (ref 4.0–10.5)

## 2013-04-28 MED ORDER — LIDOCAINE HCL (PF) 1 % IJ SOLN
30.0000 mL | INTRAMUSCULAR | Status: DC | PRN
  Administered 2013-04-28: 30 mL via SUBCUTANEOUS
  Filled 2013-04-28: qty 30

## 2013-04-28 MED ORDER — WITCH HAZEL-GLYCERIN EX PADS: 1.0000 "application " | MEDICATED_PAD | CUTANEOUS | Status: DC | PRN

## 2013-04-28 MED ORDER — BUTORPHANOL TARTRATE 1 MG/ML IJ SOLN: 1.0000 mg | INTRAMUSCULAR | Status: DC | PRN

## 2013-04-28 MED ORDER — EPHEDRINE 5 MG/ML INJ
10.0000 mg | INTRAVENOUS | Status: DC | PRN
  Filled 2013-04-28: qty 2

## 2013-04-28 MED ORDER — OXYTOCIN 40 UNITS IN LACTATED RINGERS INFUSION - SIMPLE MED
1.0000 m[IU]/min | INTRAVENOUS | Status: DC
  Administered 2013-04-28: 2 m[IU]/min via INTRAVENOUS
  Filled 2013-04-28: qty 1000

## 2013-04-28 MED ORDER — LACTATED RINGERS IV SOLN
500.0000 mL | Freq: Once | INTRAVENOUS | Status: AC
  Administered 2013-04-28: 500 mL via INTRAVENOUS

## 2013-04-28 MED ORDER — TETANUS-DIPHTH-ACELL PERTUSSIS 5-2.5-18.5 LF-MCG/0.5 IM SUSP
0.5000 mL | Freq: Once | INTRAMUSCULAR | Status: AC
  Administered 2013-04-29: 0.5 mL via INTRAMUSCULAR
  Filled 2013-04-28: qty 0.5

## 2013-04-28 MED ORDER — DIBUCAINE 1 % RE OINT: 1.0000 "application " | TOPICAL_OINTMENT | RECTAL | Status: DC | PRN

## 2013-04-28 MED ORDER — SODIUM BICARBONATE 8.4 % IV SOLN
INTRAVENOUS | Status: DC | PRN
  Administered 2013-04-28: 4 mL via EPIDURAL
  Administered 2013-04-28: 5 mL via EPIDURAL

## 2013-04-28 MED ORDER — SIMETHICONE 80 MG PO CHEW: 80.0000 mg | CHEWABLE_TABLET | ORAL | Status: DC | PRN

## 2013-04-28 MED ORDER — PHENYLEPHRINE 40 MCG/ML (10ML) SYRINGE FOR IV PUSH (FOR BLOOD PRESSURE SUPPORT)
80.0000 ug | PREFILLED_SYRINGE | INTRAVENOUS | Status: DC | PRN
  Filled 2013-04-28: qty 2

## 2013-04-28 MED ORDER — LACTATED RINGERS IV SOLN
INTRAVENOUS | Status: DC
  Administered 2013-04-28 (×2): via INTRAVENOUS

## 2013-04-28 MED ORDER — IBUPROFEN 600 MG PO TABS
600.0000 mg | ORAL_TABLET | Freq: Four times a day (QID) | ORAL | Status: DC
  Administered 2013-04-29 – 2013-04-30 (×5): 600 mg via ORAL
  Filled 2013-04-28 (×6): qty 1

## 2013-04-28 MED ORDER — BENZOCAINE-MENTHOL 20-0.5 % EX AERO
1.0000 "application " | INHALATION_SPRAY | CUTANEOUS | Status: DC | PRN
  Administered 2013-04-29: 1 via TOPICAL
  Filled 2013-04-28: qty 56

## 2013-04-28 MED ORDER — ONDANSETRON HCL 4 MG/2ML IJ SOLN: 4.0000 mg | Freq: Four times a day (QID) | INTRAMUSCULAR | Status: DC | PRN

## 2013-04-28 MED ORDER — OXYCODONE-ACETAMINOPHEN 5-325 MG PO TABS
1.0000 | ORAL_TABLET | ORAL | Status: DC | PRN
  Administered 2013-04-28: 1 via ORAL
  Filled 2013-04-28: qty 2

## 2013-04-28 MED ORDER — FENTANYL 2.5 MCG/ML BUPIVACAINE 1/10 % EPIDURAL INFUSION (WH - ANES)
INTRAMUSCULAR | Status: AC
  Filled 2013-04-28: qty 125

## 2013-04-28 MED ORDER — ACETAMINOPHEN 325 MG PO TABS: 650.0000 mg | ORAL_TABLET | ORAL | Status: DC | PRN

## 2013-04-28 MED ORDER — CITRIC ACID-SODIUM CITRATE 334-500 MG/5ML PO SOLN: 30.0000 mL | ORAL | Status: DC | PRN

## 2013-04-28 MED ORDER — OXYCODONE-ACETAMINOPHEN 5-325 MG PO TABS
1.0000 | ORAL_TABLET | ORAL | Status: DC | PRN
  Administered 2013-04-29: 1 via ORAL
  Filled 2013-04-28: qty 1

## 2013-04-28 MED ORDER — PHENYLEPHRINE 40 MCG/ML (10ML) SYRINGE FOR IV PUSH (FOR BLOOD PRESSURE SUPPORT)
PREFILLED_SYRINGE | INTRAVENOUS | Status: AC
  Filled 2013-04-28: qty 10

## 2013-04-28 MED ORDER — OXYTOCIN 40 UNITS IN LACTATED RINGERS INFUSION - SIMPLE MED: 62.5000 mL/h | INTRAVENOUS | Status: DC

## 2013-04-28 MED ORDER — FENTANYL 2.5 MCG/ML BUPIVACAINE 1/10 % EPIDURAL INFUSION (WH - ANES)
INTRAMUSCULAR | Status: DC | PRN
  Administered 2013-04-28: 14 mL/h via EPIDURAL

## 2013-04-28 MED ORDER — ONDANSETRON HCL 4 MG PO TABS
4.0000 mg | ORAL_TABLET | ORAL | Status: DC | PRN
  Administered 2013-04-29: 4 mg via ORAL
  Filled 2013-04-28: qty 1

## 2013-04-28 MED ORDER — DIPHENHYDRAMINE HCL 50 MG/ML IJ SOLN: 12.5000 mg | INTRAMUSCULAR | Status: DC | PRN

## 2013-04-28 MED ORDER — TERBUTALINE SULFATE 1 MG/ML IJ SOLN: 0.2500 mg | Freq: Once | INTRAMUSCULAR | Status: DC | PRN

## 2013-04-28 MED ORDER — MEASLES, MUMPS & RUBELLA VAC ~~LOC~~ INJ
0.5000 mL | INJECTION | Freq: Once | SUBCUTANEOUS | Status: DC
  Filled 2013-04-28: qty 0.5

## 2013-04-28 MED ORDER — OXYTOCIN BOLUS FROM INFUSION: 500.0000 mL | INTRAVENOUS | Status: DC

## 2013-04-28 MED ORDER — FENTANYL 2.5 MCG/ML BUPIVACAINE 1/10 % EPIDURAL INFUSION (WH - ANES): 14.0000 mL/h | INTRAMUSCULAR | Status: DC | PRN

## 2013-04-28 MED ORDER — IBUPROFEN 600 MG PO TABS
600.0000 mg | ORAL_TABLET | Freq: Four times a day (QID) | ORAL | Status: DC | PRN
  Administered 2013-04-28: 600 mg via ORAL
  Filled 2013-04-28: qty 1

## 2013-04-28 MED ORDER — LACTATED RINGERS IV SOLN: 500.0000 mL | INTRAVENOUS | Status: DC | PRN

## 2013-04-28 MED ORDER — ZOLPIDEM TARTRATE 5 MG PO TABS: 5.0000 mg | ORAL_TABLET | Freq: Every evening | ORAL | Status: DC | PRN

## 2013-04-28 MED ORDER — EPHEDRINE 5 MG/ML INJ
INTRAVENOUS | Status: AC
  Filled 2013-04-28: qty 4

## 2013-04-28 MED ORDER — SENNOSIDES-DOCUSATE SODIUM 8.6-50 MG PO TABS
2.0000 | ORAL_TABLET | ORAL | Status: DC
  Administered 2013-04-29: 2 via ORAL
  Filled 2013-04-28 (×2): qty 2

## 2013-04-28 MED ORDER — LIDOCAINE HCL (PF) 1 % IJ SOLN
INTRAMUSCULAR | Status: DC | PRN
  Administered 2013-04-28 (×2): 4 mL

## 2013-04-28 MED ORDER — ONDANSETRON HCL 4 MG/2ML IJ SOLN: 4.0000 mg | INTRAMUSCULAR | Status: DC | PRN

## 2013-04-28 NOTE — Anesthesia Procedure Notes (Signed)
Epidural Patient location during procedure: OB Start time: 04/28/2013 3:26 PM  Staffing Anesthesiologist: Comer Devins A. Performed by: anesthesiologist   Preanesthetic Checklist Completed: patient identified, site marked, surgical consent, pre-op evaluation, timeout performed, IV checked, risks and benefits discussed and monitors and equipment checked  Epidural Patient position: sitting Prep: site prepped and draped and DuraPrep Patient monitoring: continuous pulse ox and blood pressure Approach: midline Location: L3-L4 Injection technique: LOR air  Needle:  Needle type: Tuohy  Needle gauge: 17 G Needle length: 9 cm and 9 Needle insertion depth: 5 cm cm Catheter type: closed end flexible Catheter size: 19 Gauge Catheter at skin depth: 10 cm Test dose: negative and Other  Assessment Events: blood not aspirated, injection not painful, no injection resistance, negative IV test and no paresthesia  Additional Notes Patient identified. Risks and benefits discussed including failed block, incomplete  Pain control, post dural puncture headache, nerve damage, paralysis, blood pressure Changes, nausea, vomiting, reactions to medications-both toxic and allergic and post Partum back pain. All questions were answered. Patient expressed understanding and wished to proceed. Sterile technique was used throughout procedure. Epidural site was Dressed with sterile barrier dressing. No paresthesias, signs of intravascular injection Or signs of intrathecal spread were encountered.  Patient was more comfortable after the epidural was dosed. Please see RN's note for documentation of vital signs and FHR which are stable.

## 2013-04-28 NOTE — MAU Note (Signed)
Patient states she has been having contractions every 5 minutes with bloody show. Denies leaking fluid. States she has felt fetal movement today but not since she was on her way to the hospital.

## 2013-04-28 NOTE — Anesthesia Preprocedure Evaluation (Signed)

## 2013-04-29 LAB — CBC
HCT: 27.6 % — ABNORMAL LOW (ref 36.0–46.0)
HEMOGLOBIN: 9.5 g/dL — AB (ref 12.0–15.0)
MCH: 32.5 pg (ref 26.0–34.0)
MCHC: 34.4 g/dL (ref 30.0–36.0)
MCV: 94.5 fL (ref 78.0–100.0)
PLATELETS: 185 10*3/uL (ref 150–400)
RBC: 2.92 MIL/uL — ABNORMAL LOW (ref 3.87–5.11)
RDW: 13.2 % (ref 11.5–15.5)
WBC: 11.7 10*3/uL — AB (ref 4.0–10.5)

## 2013-04-29 LAB — RUBELLA SCREEN: RUBELLA: 1.89 {index} — AB (ref <0.90)

## 2013-04-29 LAB — RPR: RPR Ser Ql: NONREACTIVE

## 2013-04-29 NOTE — Progress Notes (Signed)
Patient is eating, ambulating, voiding.  Pain control is good.  Appropriate lochia.  No complaints.  Filed Vitals:   04/28/13 2232 04/28/13 2311 04/29/13 0035 04/29/13 0414  BP: 130/46 125/81 113/72 121/82  Pulse: 92 80 87 82  Temp:  98.5 F (36.9 C) 98.4 F (36.9 C) 98.2 F (36.8 C)  TempSrc:  Oral Oral Oral  Resp: 20 18 18 18   Height:      Weight:      SpO2:        Fundus firm Perineum without swelling. No CT  Lab Results  Component Value Date   WBC 10.7* 04/28/2013   HGB 11.6* 04/28/2013   HCT 32.7* 04/28/2013   MCV 94.2 04/28/2013   PLT 216 04/28/2013    O/Positive/-- (10/09 0000)  A/P Post partum day 1. Doing well.  Routine care.  Expect d/c 3/26.    Philip AspenALLAHAN, Taylon Coole

## 2013-04-29 NOTE — Progress Notes (Signed)
UR chart review completed.  

## 2013-04-29 NOTE — Anesthesia Postprocedure Evaluation (Signed)
Anesthesia Post Note  Patient: Kelli BoatmanWhitney Engert  Procedure(s) Performed: * No procedures listed *  Anesthesia type: Epidural  Patient location: Mother/Baby  Post pain: Pain level controlled  Post assessment: Post-op Vital signs reviewed  Last Vitals:  Filed Vitals:   04/29/13 0414  BP: 121/82  Pulse: 82  Temp: 36.8 C  Resp: 18    Post vital signs: Reviewed  Level of consciousness:alert  Complications: No apparent anesthesia complications

## 2013-04-29 NOTE — Lactation Note (Signed)
This note was copied from the chart of Kelli Nida BoatmanWhitney Warshawsky. Lactation Consultation Note Initial consult:  Baby Kelli 3713 hours old and mother states she just finished breastfeding for 10 min. Has baby Kelli in cradle position.  Baby showing feeding cues.  Reviewed STS.   Assisted mother in placing baby in football hold.  Mother states she has been taught hand expression. Baby latched easily, encouraged depth.  Reviewed breast massage to keep baby actively sucking. Reviewed basics, LC/OP services and lactation brochure.   Encouraged mother to call for further assistance.    Patient Name: Kelli Foster BJYNW'GToday's Date: 04/29/2013 Reason for consult: Initial assessment   Maternal Data    Feeding Feeding Type: Breast Fed Length of feed: 25 min  LATCH Score/Interventions Latch: Grasps breast easily, tongue down, lips flanged, rhythmical sucking.  Audible Swallowing: A few with stimulation Intervention(s): Alternate breast massage  Type of Nipple: Everted at rest and after stimulation  Comfort (Breast/Nipple): Soft / non-tender     Hold (Positioning): Assistance needed to correctly position infant at breast and maintain latch.  LATCH Score: 8  Lactation Tools Discussed/Used     Consult Status Consult Status: Follow-up Date: 04/30/13 Follow-up type: In-patient    Dahlia ByesBerkelhammer, Kinzy Weyers [facility] 04/29/2013, 10:51 AM

## 2013-04-29 NOTE — H&P (Signed)
Pt is a 23 year old black female,G3P0 who presented to the ER in early labor. She did not change her cx however her NST just met criteria for being reactive. She had a BPP of 8/8 but her AFI was only 7.5 therefore she was admitted. PMHX: see [facility] PE: VSSAF         HEENT- wnl         ABD-gravid, no masses         Cx=50/1/-1 tx IMP/ IUP at term with contractions and a low AFI PLAN/ Admit

## 2013-04-30 NOTE — Progress Notes (Signed)
Patient is eating, ambulating, voiding.  Pain control is good.  Filed Vitals:   04/29/13 0414 04/29/13 1000 04/29/13 1818 04/30/13 0530  BP: 121/82 113/75 134/82 123/80  Pulse: 82 82 76 75  Temp: 98.2 F (36.8 C) 98.7 F (37.1 C) 98.1 F (36.7 C) 98 F (36.7 C)  TempSrc: Oral Oral Oral Oral  Resp: 18 18 18 16   Height:      Weight:      SpO2:        Fundus firm Perineum without swelling.  Lab Results  Component Value Date   WBC 11.7* 04/29/2013   HGB 9.5* 04/29/2013   HCT 27.6* 04/29/2013   MCV 94.5 04/29/2013   PLT 185 04/29/2013    O/Positive/-- (10/09 0000)/RI  A/P Post partum day 2.  Routine care.  Expect d/c today.    Kerianne Gurr A

## 2013-04-30 NOTE — Discharge Summary (Signed)
Obstetric Discharge Summary Reason for Admission: onset of labor Prenatal Procedures: none Intrapartum Procedures: spontaneous vaginal delivery Postpartum Procedures: none Complications-Operative and Postpartum: 2 degree perineal laceration Hemoglobin  Date Value Ref Range Status  04/29/2013 9.5* 12.0 - 15.0 g/dL Final     DELTA CHECK NOTED     REPEATED TO VERIFY     HCT  Date Value Ref Range Status  04/29/2013 27.6* 36.0 - 46.0 % Final    Discharge Diagnoses: Term Pregnancy-delivered  Discharge Information: Date: 04/30/2013 Activity: pelvic rest Diet: routine Medications: Ibuprofen and Iron Condition: stable Instructions: refer to practice specific booklet Discharge to: home Follow-up Information   Follow up with Levi AlandANDERSON,MARK E, MD In 4 weeks.   Specialty:  Obstetrics and Gynecology   Contact information:   [address] [place] [id number] [phone]       Newborn Data: Live born female  Birth Weight: 6 lb 4.5 oz (2850 g) APGAR: 8, 9  Home with mother.  Kelli Foster A 04/30/2013, 7:39 AM

## 2013-04-30 NOTE — Lactation Note (Signed)
This note was copied from the chart of Girl Nida BoatmanWhitney Iwai.  Lactation Consultation Note: Follow up visit with mom before DC. Mom reports that she just fed about 20 minutes ago. Baby alert and mom is giving her pacifier. Encouraged to latch her again instead of pacifier. Mom easily latched baby with minimal assistance from me. Reviewed keeping the baby close to the breast throughout the feeding and good pillow support for her arm. No questions at present. [facility] To call prn.  Patient Name: Girl Nida BoatmanWhitney Hum ZOXWR'UToday's Date: 04/30/2013 Reason for consult: Follow-up assessment   Maternal Data    Feeding Feeding Type: Breast Fed Length of feed: 20 min  LATCH Score/Interventions Latch: Grasps breast easily, tongue down, lips flanged, rhythmical sucking.  Audible Swallowing: A few with stimulation  Type of Nipple: Everted at rest and after stimulation  Comfort (Breast/Nipple): Soft / non-tender     Hold (Positioning): Assistance needed to correctly position infant at breast and maintain latch. Intervention(s): Breastfeeding basics reviewed;Support Pillows  LATCH Score: 8  Lactation Tools Discussed/Used     Consult Status Consult Status: Complete    Pamelia HoitWeeks, Chazlyn Cude D 04/30/2013, 8:11 AM

## 2013-07-02 ENCOUNTER — Emergency Department (HOSPITAL_BASED_OUTPATIENT_CLINIC_OR_DEPARTMENT_OTHER)
Admission: EM | Admit: 2013-07-02 | Discharge: 2013-07-02 | Disposition: A | Discharge status: Home / Self Care | Payer: Medicaid Other | Attending: Emergency Medicine | Admitting: Emergency Medicine

## 2013-07-02 ENCOUNTER — Encounter (HOSPITAL_BASED_OUTPATIENT_CLINIC_OR_DEPARTMENT_OTHER): Payer: Self-pay | Admitting: Emergency Medicine

## 2013-07-02 DIAGNOSIS — H00019 Hordeolum externum unspecified eye, unspecified eyelid: Secondary | ICD-10-CM | POA: Insufficient documentation

## 2013-07-02 DIAGNOSIS — Z8619 Personal history of other infectious and parasitic diseases: Secondary | ICD-10-CM | POA: Insufficient documentation

## 2013-07-02 DIAGNOSIS — Z79899 Other long term (current) drug therapy: Secondary | ICD-10-CM | POA: Insufficient documentation

## 2013-07-02 MED ORDER — ERYTHROMYCIN 5 MG/GM OP OINT
TOPICAL_OINTMENT | Freq: Three times a day (TID) | OPHTHALMIC | Status: DC
  Administered 2013-07-02: 17:00:00 via OPHTHALMIC
  Filled 2013-07-02: qty 3.5

## 2013-07-02 MED ORDER — SULFAMETHOXAZOLE-TRIMETHOPRIM 800-160 MG PO TABS: 1.0000 | ORAL_TABLET | Freq: Two times a day (BID) | ORAL | Status: DC

## 2013-07-02 NOTE — ED Provider Notes (Signed)
CSN: [id number]     Arrival date & time 07/02/13  1640 History   First MD Initiated Contact with Patient 07/02/13 1648     Chief Complaint  Patient presents with  . Facial Swelling     (Consider location/radiation/quality/duration/timing/severity/associated sxs/prior Treatment) HPI Comments: Pt states that she had small bump on her left upper eyelid yesterday and today most of the eyelid is swollen and she is having drainage and crusting to the area.no visual changes.  The history is provided by the patient. No language interpreter was used.    Past Medical History  Diagnosis Date  . Gonorrhea   . Vaginal Pap smear, abnormal    Past Surgical History  Procedure Laterality Date  . No past surgeries     No family history on file. History  Substance Use Topics  . Smoking status: Never Smoker   . Smokeless tobacco: Not on file  . Alcohol Use: No   OB History   Grav Para Term Preterm Abortions TAB SAB Ect Mult Living   3 1 1  2 2    1      Review of Systems  Respiratory: Negative.   Cardiovascular: Negative.       Allergies  Review of patient's allergies indicates no known allergies.  Home Medications   Prior to Admission medications   Medication Sig Start Date End Date Taking? Authorizing Provider  ondansetron (ZOFRAN ODT) 4 MG disintegrating tablet Take 1 tablet (4 mg total) by mouth every 8 (eight) hours as needed for nausea. 07/07/12   Teressa Lower, NP  Prenatal Vit-Fe Fumarate-FA (MULTIVITAMIN-PRENATAL) 27-0.8 MG TABS tablet Take 1 tablet by mouth daily at 12 noon.    Historical Provider, MD  sulfamethoxazole-trimethoprim (SEPTRA DS) 800-160 MG per tablet Take 1 tablet by mouth every 12 (twelve) hours. 07/02/13   Teressa Lower, NP   BP 124/77  Pulse 80  Temp(Src) 97.9 F (36.6 C) (Oral)  Resp 16  Ht 5\' 4"  (1.626 m)  Wt 148 lb (67.132 kg)  BMI 25.39 kg/m2  SpO2 100%  LMP 07/01/2013  Breastfeeding? No Physical Exam  Nursing note and vitals  reviewed. Constitutional: She appears well-developed and well-nourished.  HENT:  Right Ear: External ear normal.  Left Ear: External ear normal.  Eyes: EOM are normal. Pupils are equal, round, and reactive to light.  Stye noted to below the left upper eyelid. Redness noted to the area. Some crusting to the lid  Cardiovascular: Normal rate and regular rhythm.   Pulmonary/Chest: Effort normal and breath sounds normal.    ED Course  Procedures (including critical care time) Labs Review Labs Reviewed - No data to display  Imaging Review No results found.   EKG Interpretation None      MDM   Final diagnoses:  Stye    Area is draining. Mild cellulitic area noted. Will treat with erythromycin and bactrim. Pt not a contact lense wearer    Teressa Lower, NP 07/02/13 1706

## 2013-07-02 NOTE — Discharge Instructions (Signed)
Sty A sty (hordeolum) is an infection of a gland in the eyelid located at the base of the eyelash. A sty may develop a white or yellow head of pus. It can be puffy (swollen). Usually, the sty will burst and pus will come out on its own. They do not leave lumps in the eyelid once they drain. A sty is often confused with another form of cyst of the eyelid called a chalazion. Chalazions occur within the eyelid and not on the edge where the bases of the eyelashes are. They often are red, sore and then form firm lumps in the eyelid. CAUSES   Germs (bacteria).  Lasting (chronic) eyelid inflammation. SYMPTOMS   Tenderness, redness and swelling along the edge of the eyelid at the base of the eyelashes.  Sometimes, there is a white or yellow head of pus. It may or may not drain. DIAGNOSIS  An ophthalmologist will be able to distinguish between a sty and a chalazion and treat the condition appropriately.  TREATMENT   Styes are typically treated with warm packs (compresses) until drainage occurs.  In rare cases, medicines that kill germs (antibiotics) may be prescribed. These antibiotics may be in the form of drops, cream or pills.  If a hard lump has formed, it is generally necessary to do a small incision and remove the hardened contents of the cyst in a minor surgical procedure done in the office.  In suspicious cases, your caregiver may send the contents of the cyst to the lab to be certain that it is not a rare, but dangerous form of cancer of the glands of the eyelid. HOME CARE INSTRUCTIONS   Wash your hands often and dry them with a clean towel. Avoid touching your eyelid. This may spread the infection to other parts of the eye.  Apply heat to your eyelid for 10 to 20 minutes, several times a day, to ease pain and help to heal it faster.  Do not squeeze the sty. Allow it to drain on its own. Wash your eyelid carefully 3 to 4 times per day to remove any pus. SEEK IMMEDIATE MEDICAL CARE IF:    Your eye becomes painful or puffy (swollen).  Your vision changes.  Your sty does not drain by itself within 3 days.  Your sty comes back within a short period of time, even with treatment.  You have redness (inflammation) around the eye.  You have a fever. Document Released: 11/01/2004 Document Revised: 04/16/2011 Document Reviewed: 07/06/2008 [employer] Patient Information 2014 [employer].  

## 2013-07-02 NOTE — ED Notes (Signed)
Left eyelid swelling since yesterday.  Pain but no itching.

## 2013-07-03 NOTE — ED Provider Notes (Signed)
Medical screening examination/treatment/procedure(s) were performed by non-physician practitioner and as supervising physician I was immediately available for consultation/collaboration.  Megan E Docherty, MD 07/03/13 0930 

## 2013-11-12 ENCOUNTER — Encounter (HOSPITAL_BASED_OUTPATIENT_CLINIC_OR_DEPARTMENT_OTHER): Payer: Self-pay | Admitting: Emergency Medicine

## 2013-11-12 ENCOUNTER — Emergency Department (HOSPITAL_BASED_OUTPATIENT_CLINIC_OR_DEPARTMENT_OTHER)
Admission: EM | Admit: 2013-11-12 | Discharge: 2013-11-12 | Disposition: A | Discharge status: Home / Self Care | Payer: Medicaid Other | Attending: Emergency Medicine | Admitting: Emergency Medicine

## 2013-11-12 DIAGNOSIS — N898 Other specified noninflammatory disorders of vagina: Secondary | ICD-10-CM | POA: Diagnosis present

## 2013-11-12 DIAGNOSIS — N760 Acute vaginitis: Secondary | ICD-10-CM | POA: Diagnosis not present

## 2013-11-12 DIAGNOSIS — Z8619 Personal history of other infectious and parasitic diseases: Secondary | ICD-10-CM | POA: Insufficient documentation

## 2013-11-12 DIAGNOSIS — N390 Urinary tract infection, site not specified: Secondary | ICD-10-CM | POA: Diagnosis not present

## 2013-11-12 DIAGNOSIS — Z3202 Encounter for pregnancy test, result negative: Secondary | ICD-10-CM | POA: Diagnosis not present

## 2013-11-12 DIAGNOSIS — B9689 Other specified bacterial agents as the cause of diseases classified elsewhere: Secondary | ICD-10-CM

## 2013-11-12 LAB — URINALYSIS, ROUTINE W REFLEX MICROSCOPIC
Bilirubin Urine: NEGATIVE
GLUCOSE, UA: NEGATIVE mg/dL
Hgb urine dipstick: NEGATIVE
Ketones, ur: NEGATIVE mg/dL
NITRITE: NEGATIVE
Protein, ur: NEGATIVE mg/dL
SPECIFIC GRAVITY, URINE: 1.02 (ref 1.005–1.030)
Urobilinogen, UA: 0.2 mg/dL (ref 0.0–1.0)
pH: 6.5 (ref 5.0–8.0)

## 2013-11-12 LAB — PREGNANCY, URINE: Preg Test, Ur: NEGATIVE

## 2013-11-12 LAB — WET PREP, GENITAL
Trich, Wet Prep: NONE SEEN
Yeast Wet Prep HPF POC: NONE SEEN

## 2013-11-12 LAB — URINE MICROSCOPIC-ADD ON

## 2013-11-12 MED ORDER — AZITHROMYCIN 250 MG PO TABS
1000.0000 mg | ORAL_TABLET | Freq: Once | ORAL | Status: AC
  Administered 2013-11-12: 1000 mg via ORAL
  Filled 2013-11-12: qty 4

## 2013-11-12 MED ORDER — METRONIDAZOLE 500 MG PO TABS: 500.0000 mg | ORAL_TABLET | Freq: Two times a day (BID) | ORAL | Status: DC

## 2013-11-12 MED ORDER — CEPHALEXIN 500 MG PO CAPS: 500.0000 mg | ORAL_CAPSULE | Freq: Two times a day (BID) | ORAL | Status: DC

## 2013-11-12 MED ORDER — CEFTRIAXONE SODIUM 250 MG IJ SOLR
250.0000 mg | Freq: Once | INTRAMUSCULAR | Status: AC
Start: 2013-11-12 — End: 2013-11-12
  Administered 2013-11-12: 250 mg via INTRAMUSCULAR
  Filled 2013-11-12: qty 250

## 2013-11-12 MED ORDER — LIDOCAINE HCL (PF) 1 % IJ SOLN
INTRAMUSCULAR | Status: AC
  Administered 2013-11-12: 5 mL
  Filled 2013-11-12: qty 5

## 2013-11-12 NOTE — ED Provider Notes (Signed)
CSN: [id number]     Arrival date & time 11/12/13  1752 History   First MD Initiated Contact with Patient 11/12/13 1917     Chief Complaint  Patient presents with  . Vaginal Discharge     (Consider location/radiation/quality/duration/timing/severity/associated sxs/prior Treatment) HPI Comments: Patient is a 441P1 23 yo F presenting to the emergency department for 2 days of white pruritic vaginal discharge with odor. Denies any pelvic or abdominal pain. No alleviating or aggravating factors. Patient does endorse recent unprotected sexual intercourse. Denies any nausea, vomiting, diarrhea. Last menstrual period was 4 weeks ago. No abdominal surgical history.  Patient is a 23 y.o. female presenting with vaginal discharge.  Vaginal Discharge   Past Medical History  Diagnosis Date  . Gonorrhea   . Vaginal Pap smear, abnormal    Past Surgical History  Procedure Laterality Date  . No past surgeries     No family history on file. History  Substance Use Topics  . Smoking status: Never Smoker   . Smokeless tobacco: Not on file  . Alcohol Use: No   OB History   Grav Para Term Preterm Abortions TAB SAB Ect Mult Living   3 1 1  2 2    1      Review of Systems  Genitourinary: Positive for vaginal discharge.  All other systems reviewed and are negative.     Allergies  Review of patient's allergies indicates no known allergies.  Home Medications   Prior to Admission medications   Medication Sig Start Date End Date Taking? Authorizing Provider  cephALEXin (KEFLEX) 500 MG capsule Take 1 capsule (500 mg total) by mouth 2 (two) times daily. 11/12/13   Vestal Markin L Leolia Vinzant, PA-C  metroNIDAZOLE (FLAGYL) 500 MG tablet Take 1 tablet (500 mg total) by mouth 2 (two) times daily. 11/12/13   Brittanya Winburn L Elizardo Chilson, PA-C  ondansetron (ZOFRAN ODT) 4 MG disintegrating tablet Take 1 tablet (4 mg total) by mouth every 8 (eight) hours as needed for nausea. 07/07/12   Teressa LowerVrinda Pickering, NP  Prenatal  Vit-Fe Fumarate-FA (MULTIVITAMIN-PRENATAL) 27-0.8 MG TABS tablet Take 1 tablet by mouth daily at 12 noon.    Historical Provider, MD  sulfamethoxazole-trimethoprim (SEPTRA DS) 800-160 MG per tablet Take 1 tablet by mouth every 12 (twelve) hours. 07/02/13   Teressa LowerVrinda Pickering, NP   BP 109/63  Pulse 62  Temp(Src) 97.8 F (36.6 C) (Oral)  Resp 18  Ht 5\' 6"  (1.676 m)  Wt 159 lb (72.122 kg)  BMI 25.68 kg/m2  SpO2 100%  LMP 10/25/2013 Physical Exam  Nursing note and vitals reviewed. Constitutional: She is oriented to person, place, and time. She appears well-developed and well-nourished. No distress.  HENT:  Head: Normocephalic and atraumatic.  Right Ear: External ear normal.  Left Ear: External ear normal.  Nose: Nose normal.  Mouth/Throat: Oropharynx is clear and moist.  Eyes: Conjunctivae are normal.  Neck: Normal range of motion. Neck supple.  Cardiovascular: Normal rate, regular rhythm and normal heart sounds.   Pulmonary/Chest: Effort normal.  Abdominal: Soft. Bowel sounds are normal. She exhibits no distension. There is no tenderness. There is no rebound and no guarding.  Musculoskeletal: Normal range of motion.  Neurological: She is alert and oriented to person, place, and time.  Skin: Skin is warm and dry. She is not diaphoretic.  Psychiatric: She has a normal mood and affect.   Exam performed by Francee PiccoloPIEPENBRINK, Korri Ask L,  exam chaperoned Date: 11/12/2013 Pelvic exam: normal external genitalia without evidence of trauma. VULVA: normal  appearing vulva with no masses, tenderness or lesion. VAGINA: normal appearing vagina with normal color and discharge, no lesions. CERVIX: normal appearing cervix without lesions, cervical motion tenderness absent, cervical os closed with out purulent discharge; vaginal discharge - white, Wet prep and DNA probe for chlamydia and GC obtained.   ADNEXA: normal adnexa in size, nontender and no masses UTERUS: uterus is normal size, shape, consistency  and nontender.   ED Course  Procedures (including critical care time) Medications  cefTRIAXone (ROCEPHIN) injection 250 mg (250 mg Intramuscular Given 11/12/13 2137)  azithromycin (ZITHROMAX) tablet 1,000 mg (1,000 mg Oral Given 11/12/13 2138)  lidocaine (PF) (XYLOCAINE) 1 % injection (5 mLs  Given 11/12/13 2137)    Labs Review Labs Reviewed  WET PREP, GENITAL - Abnormal; Notable for the following:    Clue Cells Wet Prep HPF POC FEW (*)    WBC, Wet Prep HPF POC FEW (*)    All other components within normal limits  URINALYSIS, ROUTINE W REFLEX MICROSCOPIC - Abnormal; Notable for the following:    APPearance CLOUDY (*)    Leukocytes, UA SMALL (*)    All other components within normal limits  URINE MICROSCOPIC-ADD ON - Abnormal; Notable for the following:    Bacteria, UA FEW (*)    All other components within normal limits  GC/CHLAMYDIA PROBE AMP  PREGNANCY, URINE    Imaging Review No results found.   EKG Interpretation None      Unable to obtain blood sample, patient states she has had a recent negative HIV test since unprotected sexual intercourse.   MDM   Final diagnoses:  UTI (lower urinary tract infection)  Bacterial vaginosis    Filed Vitals:   11/12/13 2139  BP: 109/63  Pulse: 62  Temp:   Resp: 18   Afebrile, NAD, non-toxic appearing, AAOx4.   1) Vaginal discharge: Patient to be discharged with instructions to follow up with OBGYN. Pt understands GC/Chlamydia cultures pending and that they will need to inform all sexual partners within the last 6 months if results return positive. Pt has been treated prophylacticly with azithromycin and rocephin due to pts history, pelvic exam, and wet prep with increased WBCs. Pt advised that she will receive a call in 48 hours if the test is positive and to refrain from sexual activity for 48 hours. If the test is positive, pt is advised to refrain from sexual activity for 10 days for the medicine to take effect.  Pt not  concerning for PID because hemodynamically stable and no cervical motion tenderness on pelvic exam. Pt has also been treated with flagyl for Bacterial Vaginosis. Pt has been advised to not drink alcohol while on this medication. Counseled pt that latex condoms are the only way to prevent against STDs or HIV.   2) UTI: Pt has been diagnosed with a UTI. Pt is afebrile, no CVA tenderness, normotensive, and denies N/V. Pt to be dc home with antibiotics and instructions to follow up with PCP if symptoms persist.   Return precautions discussed. Patient is agreeable to plan. Patient stable at time of discharge.   Jeannetta Ellis, PA-C 11/12/13 2205

## 2013-11-12 NOTE — Discharge Instructions (Signed)
Please follow up with your primary care physician in 1-2 days. If you do not have one [facility] listed [facility]. Please follow up with an Ob/Gyn to schedule a follow up appointment.  Please take your antibiotic until completion. Someone will call in 48-72 hours for a positive test result from your visit today. Please read all discharge instructions and return precautions.   Urinary Tract Infection Urinary tract infections (UTIs) can develop anywhere along your urinary tract. Your urinary tract is your body's drainage system for removing wastes and extra water. Your urinary tract includes two kidneys, two ureters, a bladder, and a urethra. Your kidneys are a pair of bean-shaped organs. Each kidney is about the size of your fist. They are located below your ribs, one on each side of your spine. CAUSES Infections are caused by microbes, which are microscopic organisms, including fungi, viruses, and bacteria. These organisms are so small that they can only be seen through a microscope. Bacteria are the microbes that most commonly cause UTIs. SYMPTOMS  Symptoms of UTIs may vary by age and gender of the patient and by the location of the infection. Symptoms in young women typically include a frequent and intense urge to urinate and a painful, burning feeling in the bladder or urethra during urination. Older women and men are more likely to be tired, shaky, and weak and have muscle aches and abdominal pain. A fever may mean the infection is in your kidneys. Other symptoms of a kidney infection include pain in your back or sides below the ribs, nausea, and vomiting. DIAGNOSIS To diagnose a UTI, your caregiver will ask you about your symptoms. Your caregiver also will ask to provide a urine sample. The urine sample will be tested for bacteria and white blood cells. White blood cells are made by your body to help fight infection. TREATMENT  Typically, UTIs can be treated  with medication. Because most UTIs are caused by a bacterial infection, they usually can be treated with the use of antibiotics. The choice of antibiotic and length of treatment depend on your symptoms and the type of bacteria causing your infection. HOME CARE INSTRUCTIONS  If you were prescribed antibiotics, take them exactly as your caregiver instructs you. Finish the medication even if you feel better after you have only taken some of the medication.  Drink enough water and fluids to keep your urine clear or pale yellow.  Avoid caffeine, tea, and carbonated beverages. They tend to irritate your bladder.  Empty your bladder often. Avoid holding urine for long periods of time.  Empty your bladder before and after sexual intercourse.  After a bowel movement, women should cleanse from front to back. Use each tissue only once. SEEK MEDICAL CARE IF:   You have back pain.  You develop a fever.  Your symptoms do not begin to resolve within 3 days. SEEK IMMEDIATE MEDICAL CARE IF:   You have severe back pain or lower abdominal pain.  You develop chills.  You have nausea or vomiting.  You have continued burning or discomfort with urination. MAKE SURE YOU:   Understand these instructions.  Will watch your condition.  Will get help right away if you are not doing well or get worse. Document Released: 11/01/2004 Document Revised: 07/24/2011 Document Reviewed: 03/02/2011 [facility] Patient Information 2015 [place], [place]. This information is not intended to replace advice given to you by your health care provider. Make sure you discuss any questions you have  with your health care provider.  Bacterial Vaginosis Bacterial vaginosis is a vaginal infection that occurs when the normal balance of bacteria in the vagina is disrupted. It results from an overgrowth of certain bacteria. This is the most common vaginal infection in women of childbearing age. Treatment is important to prevent  complications, especially in pregnant women, as it can cause a premature delivery. CAUSES  Bacterial vaginosis is caused by an increase in harmful bacteria that are normally present in smaller amounts in the vagina. Several different kinds of bacteria can cause bacterial vaginosis. However, the reason that the condition develops is not fully understood. RISK FACTORS Certain activities or behaviors can put you at an increased risk of developing bacterial vaginosis, including:  Having a new sex partner or multiple sex partners.  Douching.  Using an intrauterine device (IUD) for contraception. Women do not get bacterial vaginosis from toilet seats, bedding, swimming pools, or contact with objects around them. SIGNS AND SYMPTOMS  Some women with bacterial vaginosis have no signs or symptoms. Common symptoms include:  Grey vaginal discharge.  A fishlike odor with discharge, especially after sexual intercourse.  Itching or burning of the vagina and vulva.  Burning or pain with urination. DIAGNOSIS  Your health care provider will take a medical history and examine the vagina for signs of bacterial vaginosis. A sample of vaginal fluid may be taken. Your health care provider will look at this sample under a microscope to check for bacteria and abnormal cells. A vaginal pH test may also be done.  TREATMENT  Bacterial vaginosis may be treated with antibiotic medicines. These may be given in the form of a pill or a vaginal cream. A second round of antibiotics may be prescribed if the condition comes back after treatment.  HOME CARE INSTRUCTIONS   Only take over-the-counter or prescription medicines as directed by your health care provider.  If antibiotic medicine was prescribed, take it as directed. Make sure you finish it even if you start to feel better.  Do not have sex until treatment is completed.  Tell all sexual partners that you have a vaginal infection. They should see their health  care provider and be treated if they have problems, such as a mild rash or itching.  Practice safe sex by using condoms and only having one sex partner. SEEK MEDICAL CARE IF:   Your symptoms are not improving after 3 days of treatment.  You have increased discharge or pain.  You have a fever. MAKE SURE YOU:   Understand these instructions.  Will watch your condition.  Will get help right away if you are not doing well or get worse. FOR MORE INFORMATION  Centers for Disease Control and Prevention, Division of STD Prevention: [employer]za American Sexual Health Association (ASHA): www.ashastd.org  Document Released: 01/22/2005 Document Revised: 11/12/2012 Document Reviewed: 09/03/2012 [facility] Patient Information 2015 [place], [place]. This information is not intended to replace advice given to you by your health care provider. Make sure you discuss any questions you have with your health care provider.

## 2013-11-12 NOTE — ED Notes (Signed)
Vaginal discharge x 2 days. Odor today.

## 2013-11-13 LAB — GC/CHLAMYDIA PROBE AMP
CT PROBE, AMP APTIMA: NEGATIVE
GC PROBE AMP APTIMA: NEGATIVE

## 2013-11-13 NOTE — ED Provider Notes (Signed)
Medical screening examination/treatment/procedure(s) were performed by non-physician practitioner and as supervising physician I was immediately available for consultation/collaboration.  Cherokee Boccio J. Ej Pinson, MD 11/13/13 0032 

## 2013-12-07 ENCOUNTER — Encounter (HOSPITAL_BASED_OUTPATIENT_CLINIC_OR_DEPARTMENT_OTHER): Payer: Self-pay | Admitting: Emergency Medicine

## 2014-02-04 IMAGING — US US OB COMP LESS 14 WK
1 series · 14 of 28 positions shown · non-contrast
Comparison: None.

CLINICAL DATA: Constipation, positive pregnancy test, unsure dates

OBSTETRIC <14 WK ULTRASOUND
TECHNIQUE: Transabdominal ultrasound was performed for evaluation
of the gestation as well as the maternal uterus and adnexal
regions.

[Series 1: us ob comp less 14 wk · 0.22mm/px · 14 of 44 slices shown]
[im 2/44]
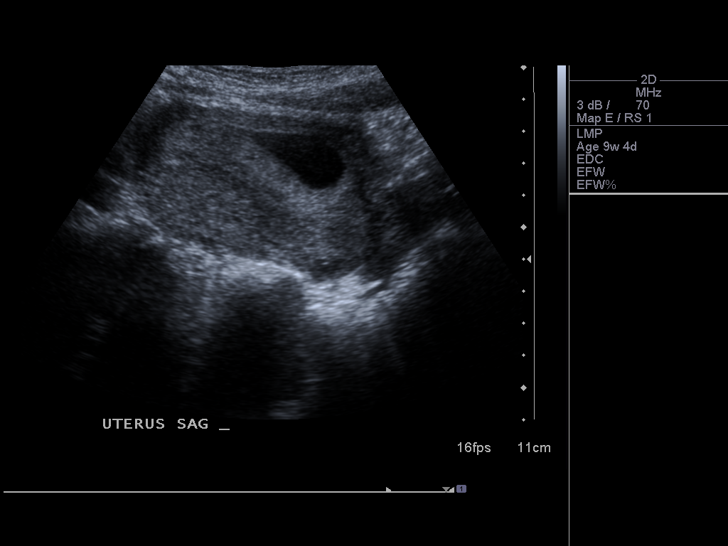
[im 5/44]
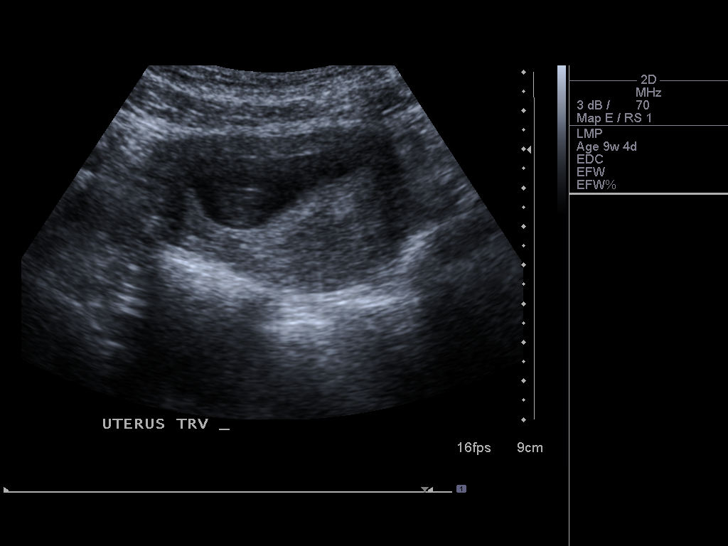
[im 8/44]
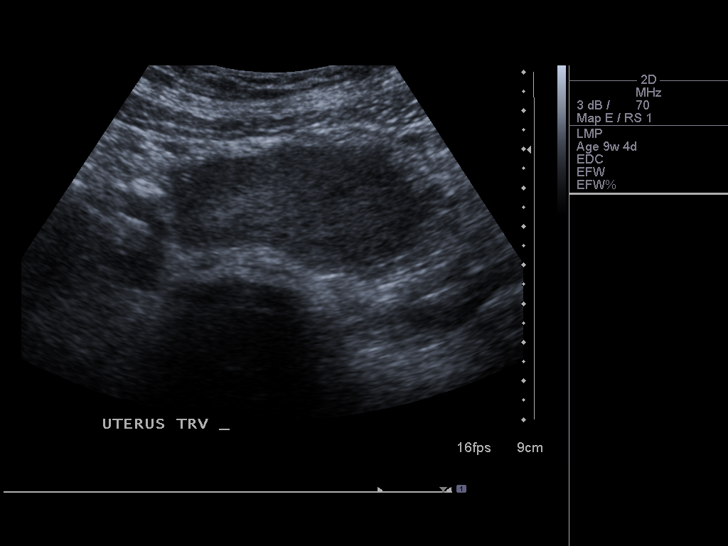
[im 12/44]
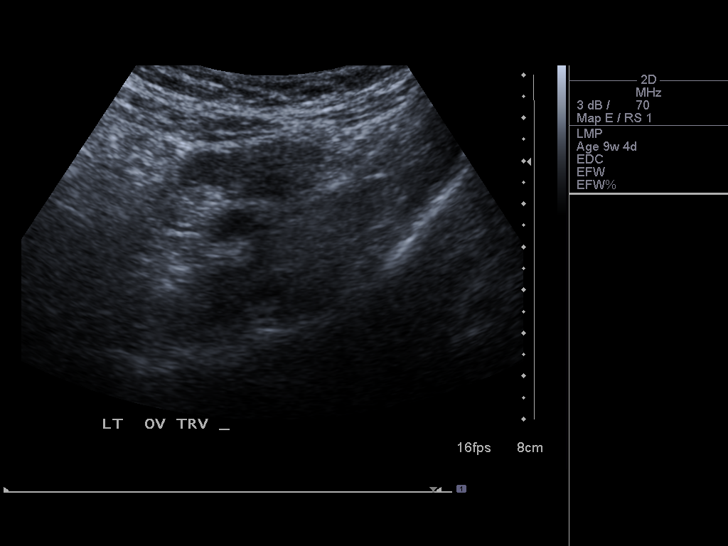
[im 15/44]
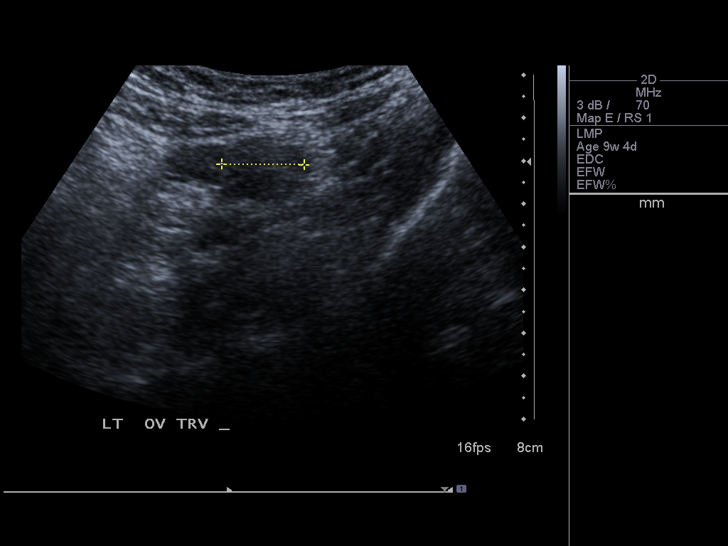
[im 18/44]
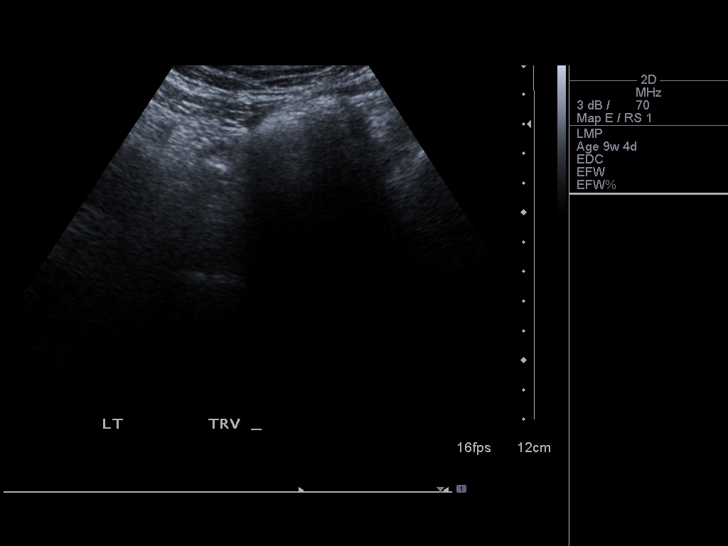
[im 21/44]
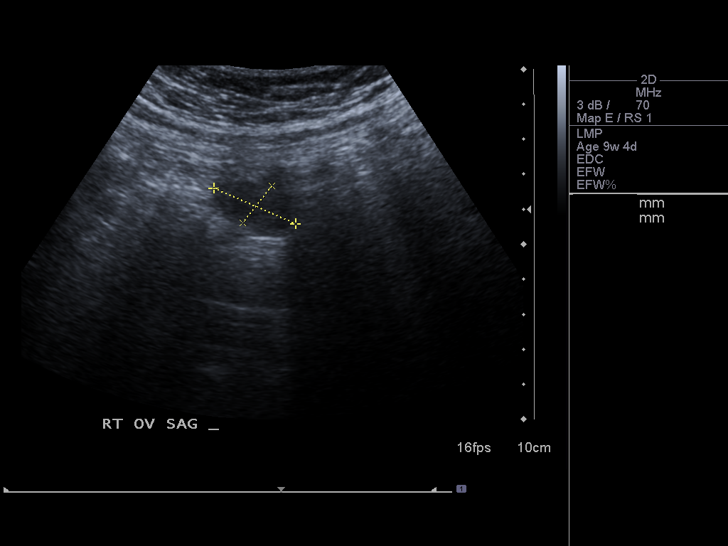
[im 24/44]
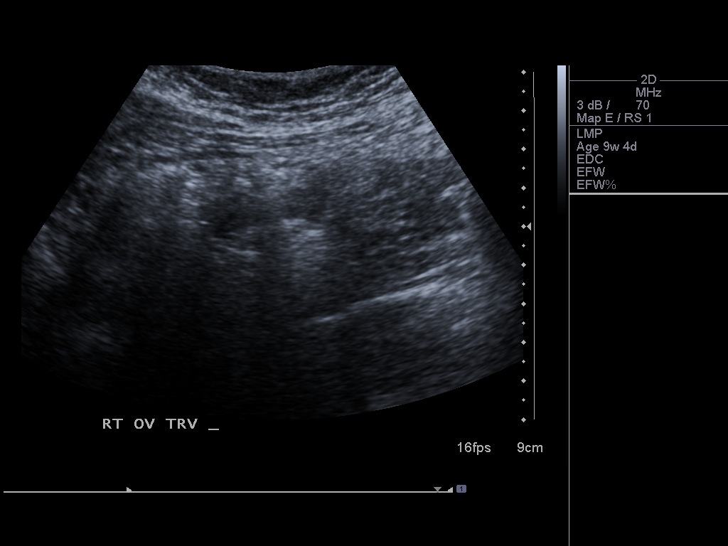
[im 28/44]
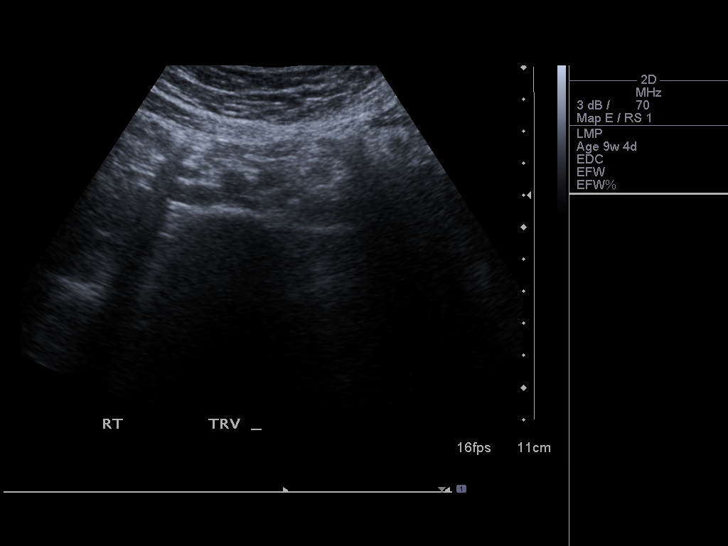
[im 31/44]
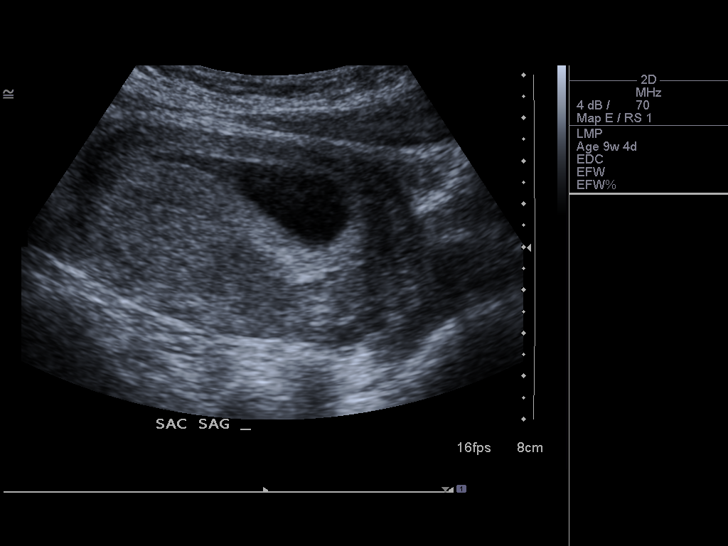
[im 34/44]
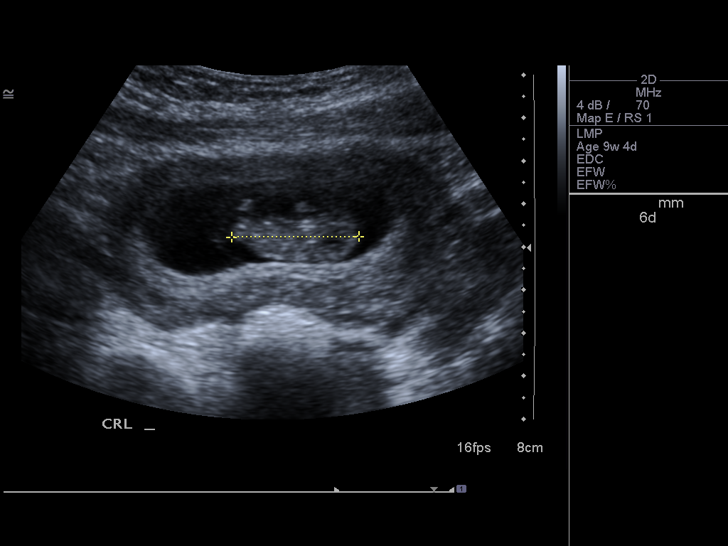
[im 37/44]
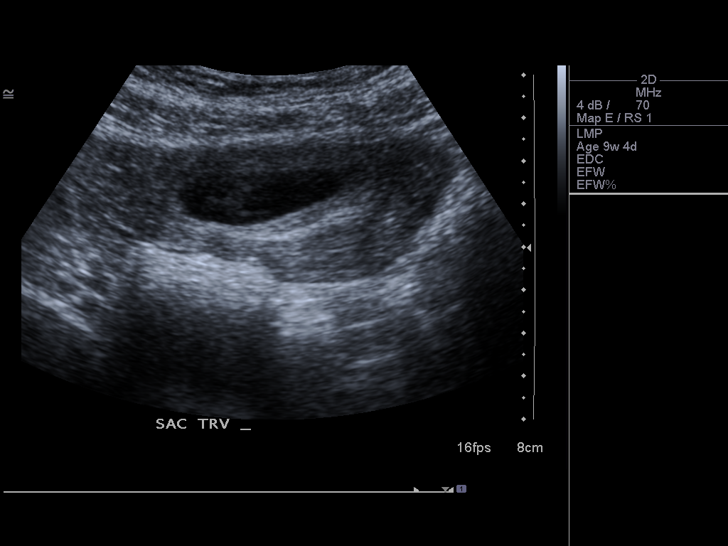
[im 40/44]
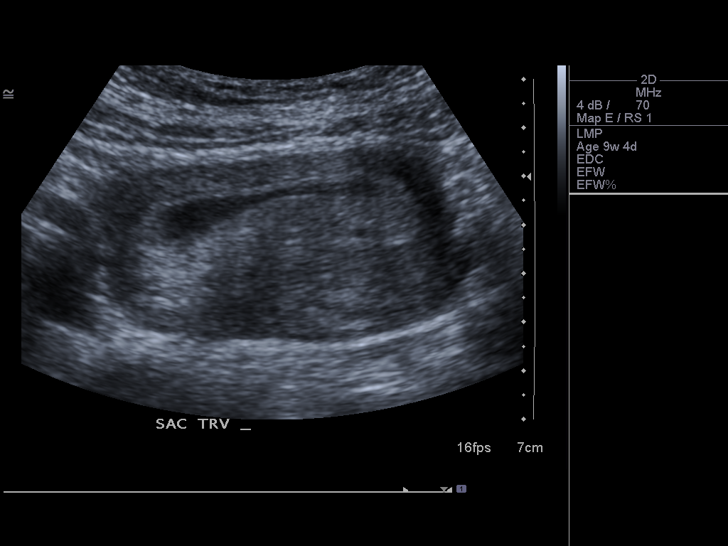
[im 44/44]
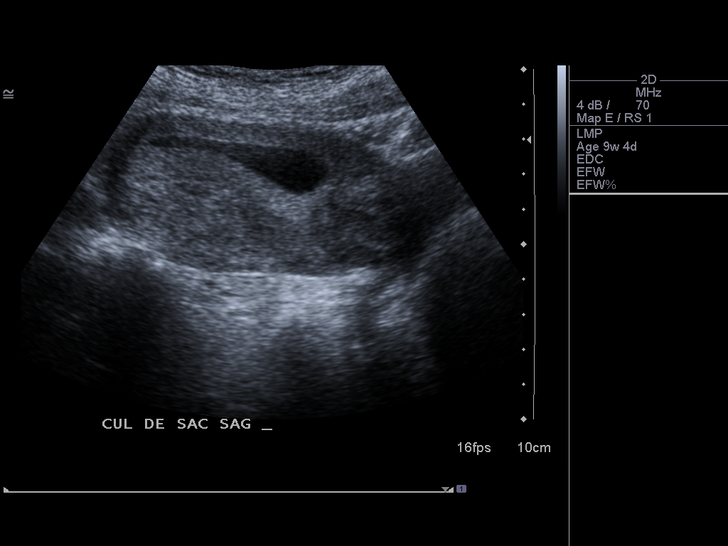

[14 of 28 positions shown; findings below may reference images not displayed]

Intrauterine gestational sac: Visualized/normal in shape.
Yolk sac: Not visualized
Embryo: Visualized
Cardiac Activity: Visualized
Heart Rate: 165 bpm

CRL:  31 mm  10 w  0 d        US EDC: 04/27/13

Maternal uterus/Adnexae:
The ovaries are normal.
IMPRESSION: Single live intrauterine gestation measuring 10 weeks 0 days by
today's exam which is concordant with assigned gestational age by
LMP of 9 weeks 4 days, EDC by LMP 04/30/2013.  No acute
abnormality.

## 2014-04-04 ENCOUNTER — Encounter (HOSPITAL_BASED_OUTPATIENT_CLINIC_OR_DEPARTMENT_OTHER): Payer: Self-pay | Admitting: *Deleted

## 2014-04-04 ENCOUNTER — Emergency Department (HOSPITAL_BASED_OUTPATIENT_CLINIC_OR_DEPARTMENT_OTHER)
Admission: EM | Admit: 2014-04-04 | Discharge: 2014-04-04 | Disposition: A | Discharge status: Home / Self Care | Payer: Medicaid Other | Attending: Emergency Medicine | Admitting: Emergency Medicine

## 2014-04-04 DIAGNOSIS — R490 Dysphonia: Secondary | ICD-10-CM | POA: Insufficient documentation

## 2014-04-04 DIAGNOSIS — Z792 Long term (current) use of antibiotics: Secondary | ICD-10-CM | POA: Insufficient documentation

## 2014-04-04 DIAGNOSIS — R519 Headache, unspecified: Secondary | ICD-10-CM

## 2014-04-04 DIAGNOSIS — Z8619 Personal history of other infectious and parasitic diseases: Secondary | ICD-10-CM | POA: Diagnosis not present

## 2014-04-04 DIAGNOSIS — R51 Headache: Secondary | ICD-10-CM | POA: Insufficient documentation

## 2014-04-04 DIAGNOSIS — J3489 Other specified disorders of nose and nasal sinuses: Secondary | ICD-10-CM | POA: Insufficient documentation

## 2014-04-04 DIAGNOSIS — Z79899 Other long term (current) drug therapy: Secondary | ICD-10-CM | POA: Insufficient documentation

## 2014-04-04 DIAGNOSIS — R0981 Nasal congestion: Secondary | ICD-10-CM | POA: Insufficient documentation

## 2014-04-04 MED ORDER — DEXAMETHASONE SODIUM PHOSPHATE 10 MG/ML IJ SOLN
10.0000 mg | Freq: Once | INTRAMUSCULAR | Status: AC
  Administered 2014-04-04: 10 mg via INTRAMUSCULAR
  Filled 2014-04-04: qty 1

## 2014-04-04 MED ORDER — METOCLOPRAMIDE HCL 5 MG/ML IJ SOLN
10.0000 mg | Freq: Once | INTRAMUSCULAR | Status: AC
  Administered 2014-04-04: 10 mg via INTRAMUSCULAR
  Filled 2014-04-04: qty 2

## 2014-04-04 MED ORDER — DIPHENHYDRAMINE HCL 50 MG/ML IJ SOLN
25.0000 mg | Freq: Once | INTRAMUSCULAR | Status: AC
  Administered 2014-04-04: 25 mg via INTRAMUSCULAR
  Filled 2014-04-04: qty 1

## 2014-04-04 NOTE — ED Provider Notes (Signed)
CSN: [id number]     Arrival date & time 04/04/14  4098 History   First MD Initiated Contact with Patient 04/04/14 [phone]     Chief Complaint  Patient presents with  . Headache     (Consider location/radiation/quality/duration/timing/severity/associated sxs/prior Treatment) HPI Comments: Patient presents with a headache. She states she's had a little bit of runny nose and nasal congestion with some laryngitis for about 3-4 days. She denies any sore throat. 3 days ago she started having a little bit of a headache in the right forehead. She said it got worse yesterday and this morning she woke up and it was worse. She denies any nausea or vomiting. There is no dizziness. She denies any numbness or weakness in her extremities. She denies any fevers. There is no neck pain. She states the pain is worse when she bends over to pick something up. She tried some Tylenol last night without relief.   Past Medical History  Diagnosis Date  . Gonorrhea   . Vaginal Pap smear, abnormal    Past Surgical History  Procedure Laterality Date  . No past surgeries     No family history on file. History  Substance Use Topics  . Smoking status: Never Smoker   . Smokeless tobacco: Not on file  . Alcohol Use: No   OB History    Gravida Para Term Preterm AB TAB SAB Ectopic Multiple Living   Review of Systems  Constitutional: Negative for fever, chills, diaphoresis and fatigue.  HENT: Positive for congestion, postnasal drip, rhinorrhea and voice change (hoarseness). Negative for sneezing.   Eyes: Negative.   Respiratory: Negative for cough, chest tightness and shortness of breath.   Cardiovascular: Negative for chest pain and leg swelling.  Gastrointestinal: Negative for nausea, vomiting, abdominal pain, diarrhea and blood in stool.  Genitourinary: Negative for frequency, hematuria, flank pain and difficulty urinating.  Musculoskeletal: Negative for back pain and arthralgias.  Skin:  Negative for rash.  Neurological: Positive for headaches. Negative for dizziness, speech difficulty, weakness and numbness.      Allergies  Review of patient's allergies indicates no known allergies.  Home Medications   Prior to Admission medications   Medication Sig Start Date End Date Taking? Authorizing Provider  cephALEXin (KEFLEX) 500 MG capsule Take 1 capsule (500 mg total) by mouth 2 (two) times daily. 11/12/13   Jennifer L Piepenbrink, PA-C  metroNIDAZOLE (FLAGYL) 500 MG tablet Take 1 tablet (500 mg total) by mouth 2 (two) times daily. 11/12/13   Jennifer L Piepenbrink, PA-C  ondansetron (ZOFRAN ODT) 4 MG disintegrating tablet Take 1 tablet (4 mg total) by mouth every 8 (eight) hours as needed for nausea. 07/07/12   Teressa Lower, NP  Prenatal Vit-Fe Fumarate-FA (MULTIVITAMIN-PRENATAL) 27-0.8 MG TABS tablet Take 1 tablet by mouth daily at 12 noon.    Historical Provider, MD  sulfamethoxazole-trimethoprim (SEPTRA DS) 800-160 MG per tablet Take 1 tablet by mouth every 12 (twelve) hours. 07/02/13   Teressa Lower, NP   BP 116/64 mmHg  Pulse 62  Temp(Src) 97.4 F (36.3 C) (Oral)  Resp 18  Ht  (1.702 m)  Wt 154 lb (69.854 kg)  BMI 24.11 kg/m2  SpO2 99%  LMP 03/23/2014 Physical Exam  Constitutional: She is oriented to person, place, and time. She appears well-developed and well-nourished.  HENT:  Head: Normocephalic and atraumatic.  No pain over the temporal artery. There is tenderness over the  right frontal sinus  Eyes: Pupils are equal, round, and reactive to light.  Neck: Normal range of motion. Neck supple.  No meningismus  Cardiovascular: Normal rate, regular rhythm and normal heart sounds.   Pulmonary/Chest: Effort normal and breath sounds normal. No respiratory distress. She has no wheezes. She has no rales. She exhibits no tenderness.  Abdominal: Soft. Bowel sounds are normal. There is no tenderness. There is no rebound and no guarding.  Musculoskeletal: Normal  range of motion. She exhibits no edema.  Lymphadenopathy:    She has no cervical adenopathy.  Neurological: She is alert and oriented to person, place, and time. She has normal strength. No cranial nerve deficit or sensory deficit. GCS eye subscore is 4. GCS verbal subscore is 5. GCS motor subscore is 6.  FTN intact, no pronator drift.  Skin: Skin is warm and dry. No rash noted.  Psychiatric: She has a normal mood and affect.    ED Course  Procedures (including critical care time) Labs Review Labs Reviewed - No data to display  Imaging Review No results found.   EKG Interpretation None      MDM   Final diagnoses:  Headache, unspecified headache type    Patient is headache seems consistent with a sinus headache. Her story does not sound consistent with a subarachnoid hemorrhage or meningitis. She has no neurologic deficits.  I will give her the migraine cocktail with decadron.  Strict return precautions given.    Rolan BuccoMelanie Jodette Wik, MD 04/04/14 [phone]

## 2014-04-04 NOTE — Discharge Instructions (Signed)

## 2014-04-04 NOTE — ED Notes (Signed)
Patient states she has had a headache for three days, only on the right side. Also c/o hoarse voice w/o pain

## 2014-07-22 ENCOUNTER — Encounter (HOSPITAL_BASED_OUTPATIENT_CLINIC_OR_DEPARTMENT_OTHER): Payer: Self-pay | Admitting: *Deleted

## 2014-07-22 ENCOUNTER — Emergency Department (HOSPITAL_BASED_OUTPATIENT_CLINIC_OR_DEPARTMENT_OTHER)
Admission: EM | Admit: 2014-07-22 | Discharge: 2014-07-22 | Disposition: A | Discharge status: Home / Self Care | Payer: Medicaid Other | Attending: Emergency Medicine | Admitting: Emergency Medicine

## 2014-07-22 DIAGNOSIS — Z8619 Personal history of other infectious and parasitic diseases: Secondary | ICD-10-CM | POA: Diagnosis not present

## 2014-07-22 DIAGNOSIS — Z79899 Other long term (current) drug therapy: Secondary | ICD-10-CM | POA: Diagnosis not present

## 2014-07-22 DIAGNOSIS — H109 Unspecified conjunctivitis: Secondary | ICD-10-CM | POA: Insufficient documentation

## 2014-07-22 DIAGNOSIS — H578 Other specified disorders of eye and adnexa: Secondary | ICD-10-CM | POA: Diagnosis present

## 2014-07-22 DIAGNOSIS — Z792 Long term (current) use of antibiotics: Secondary | ICD-10-CM | POA: Diagnosis not present

## 2014-07-22 MED ORDER — OLOPATADINE HCL 0.1 % OP SOLN: 1.0000 [drp] | Freq: Two times a day (BID) | OPHTHALMIC | Status: DC

## 2014-07-22 MED ORDER — TETRACAINE HCL 0.5 % OP SOLN
2.0000 [drp] | Freq: Once | OPHTHALMIC | Status: AC
  Administered 2014-07-22: 2 [drp] via OPHTHALMIC
  Filled 2014-07-22: qty 2

## 2014-07-22 MED ORDER — FLUORESCEIN SODIUM 1 MG OP STRP
1.0000 | ORAL_STRIP | Freq: Once | OPHTHALMIC | Status: AC
  Administered 2014-07-22: 1 via OPHTHALMIC
  Filled 2014-07-22: qty 1

## 2014-07-22 NOTE — ED Notes (Signed)
Left eye has been swelling x 2 days. Red, tearing and itching.

## 2014-07-22 NOTE — ED Provider Notes (Signed)
CSN: [id number]     Arrival date & time 07/22/14  1732 History   First MD Initiated Contact with Patient 07/22/14 1749     Chief Complaint  Patient presents with  . Eye Pain     (Consider location/radiation/quality/duration/timing/severity/associated sxs/prior Treatment) HPI Kelli Foster is a 24 y.o. female because in for evaluation of left eye redness and swelling. Patient states for the past 2 days her eyes have been red with eyelid swelling and a clear discharge. She reports last night she felt like she had something caught in her eyelid, but this sensation was gone today. Denies eye pain or pressure. No photophobia. She denies any trauma, difficulty with eye movements. She reports she used to wear contacts but stopped wearing them four months ago. Denies fevers, headache, nausea or vomiting, sore throat, cough. No other aggravating or modifying factors.  Past Medical History  Diagnosis Date  . Gonorrhea   . Vaginal Pap smear, abnormal    Past Surgical History  Procedure Laterality Date  . No past surgeries     No family history on file. History  Substance Use Topics  . Smoking status: Never Smoker   . Smokeless tobacco: Not on file  . Alcohol Use: No   OB History    Gravida Para Term Preterm AB TAB SAB Ectopic Multiple Living   3 1 1  2 2    1      Review of Systems  A 10 point review of systems was completed and was negative except for pertinent positives and negatives as mentioned in the history of present illness    Allergies  Review of patient's allergies indicates no known allergies.  Home Medications   Prior to Admission medications   Medication Sig Start Date End Date Taking? Authorizing Provider  cephALEXin (KEFLEX) 500 MG capsule Take 1 capsule (500 mg total) by mouth 2 (two) times daily. 11/12/13   Jennifer Piepenbrink, PA-C  metroNIDAZOLE (FLAGYL) 500 MG tablet Take 1 tablet (500 mg total) by mouth 2 (two) times daily. 11/12/13   Jennifer Piepenbrink, PA-C   olopatadine (PATANOL) 0.1 % ophthalmic solution Place 1 drop into the left eye 2 (two) times daily. 07/22/14   Joycie Peek, PA-C  ondansetron (ZOFRAN ODT) 4 MG disintegrating tablet Take 1 tablet (4 mg total) by mouth every 8 (eight) hours as needed for nausea. 07/07/12   Teressa Lower, NP  Prenatal Vit-Fe Fumarate-FA (MULTIVITAMIN-PRENATAL) 27-0.8 MG TABS tablet Take 1 tablet by mouth daily at 12 noon.    Historical Provider, MD  sulfamethoxazole-trimethoprim (SEPTRA DS) 800-160 MG per tablet Take 1 tablet by mouth every 12 (twelve) hours. 07/02/13   Teressa Lower, NP   BP 106/63 mmHg  Pulse 81  Temp(Src) 98 F (36.7 C) (Oral)  Resp 16  Ht 5\' 6"  (1.676 m)  Wt 154 lb (69.854 kg)  BMI 24.87 kg/m2  SpO2 98%  LMP 07/19/2014 Physical Exam  Constitutional:  Awake, alert, nontoxic appearance.  HENT:  Head: Atraumatic.  Eyes: Right eye exhibits no discharge. Left eye exhibits no discharge.  Extraocular movements intact without discomfort. No periorbital tenderness. No evidence of free or post-septal cellulitis. Upon lid eversion there is no evidence of foreign body  Neck: Neck supple.  Pulmonary/Chest: Effort normal. She exhibits no tenderness.  Abdominal: Soft. There is no tenderness. There is no rebound.  Musculoskeletal: She exhibits no tenderness.  Baseline ROM, no obvious new focal weakness.  Neurological:  Mental status and motor strength appears baseline for patient and situation.  Skin: No rash noted.  Psychiatric: She has a normal mood and affect.  Nursing note and vitals reviewed.   ED Course  Procedures (including critical care time) Labs Review Labs Reviewed - No data to display  Imaging Review No results found.   EKG Interpretation None     Meds given in ED:  Medications  fluorescein ophthalmic strip 1 strip (not administered)  tetracaine (PONTOCAINE) 0.5 % ophthalmic solution 2 drop (not administered)    New Prescriptions   OLOPATADINE (PATANOL)  0.1 % OPHTHALMIC SOLUTION    Place 1 drop into the left eye 2 (two) times daily.   Filed Vitals:   07/22/14 1741  BP: 106/63  Pulse: 81  Temp: 98 F (36.7 C)  TempSrc: Oral  Resp: 16  Height:  (1.676 m)  Weight: 154 lb (69.854 kg)  SpO2: 98%     Visual Acuity  Right Eye Distance: 20/50 Left Eye Distance: 20/50 Bilateral Distance: 20/50 (pt states she is "supposed to wear glasses but stopped")  Right Eye Near:   Left Eye Near:    Bilateral Near:    acuity  MDM  Vitals stable - WNL -afebrile Pt resting comfortably in ED. PE--physical exam not concerning and most consistent with viral conjunctivitis. Fluorescein staining and subsequent Woods lamp evaluation showed no evidence of corneal abrasion or herpetic lesions. Low concern for uveitis, keratitis, acute angle closure glaucoma or other acute or emergent pathology. Symptoms most likely due to nonbacterial conjunctivitis. Will DC with ophthalmic drops and outpatient follow-up with primary care. I discussed all relevant lab findings and imaging results with pt and they verbalized understanding. Discussed f/u with PCP within 48 hrs and return precautions, pt very amenable to plan.   Final diagnoses:  Conjunctivitis of left eye        Joycie Peek, PA-C 07/22/14 1905  Margarita Grizzle, MD 07/22/14 2108

## 2014-07-22 NOTE — Discharge Instructions (Signed)
Conjunctivitis °Conjunctivitis is commonly called "pink eye." Conjunctivitis can be caused by bacterial or viral infection, allergies, or injuries. There is usually redness of the lining of the eye, itching, discomfort, and sometimes discharge. There may be deposits of matter along the eyelids. A viral infection usually causes a watery discharge, while a bacterial infection causes a yellowish, thick discharge. Pink eye is very contagious and spreads by direct contact. °You may be given antibiotic eyedrops as part of your treatment. Before using your eye medicine, remove all drainage from the eye by washing gently with warm water and cotton balls. Continue to use the medication until you have awakened 2 mornings in a row without discharge from the eye. Do not rub your eye. This increases the irritation and helps spread infection. Use separate towels from other household members. Wash your hands with soap and water before and after touching your eyes. Use cold compresses to reduce pain and sunglasses to relieve irritation from light. Do not wear contact lenses or wear eye makeup until the infection is gone. °SEEK MEDICAL CARE IF:  °· Your symptoms are not better after 3 days of treatment. °· You have increased pain or trouble seeing. °· The outer eyelids become very red or swollen. °Document Released: 03/01/2004 Document Revised: 04/16/2011 Document Reviewed: 01/22/2005 °ExitCare® Patient Information ©2015 [employer]. This information is not intended to replace advice given to you by your health care provider. Make sure you discuss any questions you have with your health care provider. ° °Eye Drops °Use eye drops as directed. It may be easier to have someone help you put the drops in your eye. If you are alone, use the following instructions to help you. °· Wash your hands before putting drops in your eyes. °· Read the label and look at your medication. Check for any expiration date that may appear on the bottle or  tube. Changes of color may be a warning that the medication is old or ineffective. This is especially true if the medication has become brown in color. If you have questions or concerns, call your caregiver. °DROPS °· Tilt your head back with the affected eye uppermost. Gently pull down on your lower lid. Do not pull up on the upper lid. °· Look up. Place the dropper or bottle just over the edge of the lower lid near the white portion at the bottom of the eye. The goal is to have the drop go into the little sac formed by the lower lid and the bottom of the eye itself. Do not release the drop from a height of several inches over the eye. That will only serve to startle the person receiving the medicine when it lands and forces a blink. °· Steady your hand in a comfortable manner. An example would be to hold the dropper or bottle between your thumb and index (pointing) finger. Lean your index finger against the brow. °· Then, slowly and gently squeeze one drop of medication into your eye. °· Once the medication has been applied, place your finger between the lower eyelid and the nose, pressing firmly against the nose for 5-10 seconds. This will slow the process of the eye drop entering the small canal that normally drains tears into the nose, and therefore increases the exposure of the medicine to the eye for a few extra seconds. °OINTMENTS °· Look up. Place the tip of the tube just over the edge of the lower lid near the white portion at the bottom of the   eye. The goal is to create a line of ointment along the inner surface of the eyelid in the little sac formed by the lower lid and the bottom of the eye itself. °· Avoid touching the tube tip to your eyeball or eyelid. This avoids contamination of the tube or the medicine in the tube. °· Once a line of medicine has been created, hold the upper lid up and look down before releasing the upper lid. This will force the ointment to spread over the surface of the  eye. °· Your vision will be very blurry for a few minutes after applying an ointment properly. This is normal and will clear as you continue to blink. For this reason, it is best to apply ointments just before going to sleep, or at a time when you can rest your eyes for 5-10 minutes after applying the medication. °GENERAL °· Store your medicine in a cool, dry place after each use. °· If you need a second medication, wait at least two minutes. This helps the first medication to be taken up (absorbed) by the eye. °· If you have been instructed to use both an eye drop and an eye ointment, always apply the drop first and then the ointment 3-4 minutes afterward. °Never put medications into the eye unless the label reads, "For Ophthalmic Use," "For Use In Eyes" or "Eye Drops." If you have questions, call your caregiver. °Document Released: 04/30/2000 Document Revised: 06/08/2013 Document Reviewed: 07/06/2008 °ExitCare® Patient Information ©2015 [employer]. This information is not intended to replace advice given to you by your health care provider. Make sure you discuss any questions you have with your health care provider. ° °

## 2014-07-22 NOTE — ED Notes (Signed)
PA at bedside.

## 2015-03-24 ENCOUNTER — Encounter (HOSPITAL_BASED_OUTPATIENT_CLINIC_OR_DEPARTMENT_OTHER): Payer: Self-pay

## 2015-03-24 ENCOUNTER — Emergency Department (HOSPITAL_BASED_OUTPATIENT_CLINIC_OR_DEPARTMENT_OTHER)
Admission: EM | Admit: 2015-03-24 | Discharge: 2015-03-24 | Disposition: A | Discharge status: Home / Self Care | Payer: Medicaid Other | Attending: Emergency Medicine | Admitting: Emergency Medicine

## 2015-03-24 DIAGNOSIS — J069 Acute upper respiratory infection, unspecified: Secondary | ICD-10-CM | POA: Insufficient documentation

## 2015-03-24 DIAGNOSIS — Z8619 Personal history of other infectious and parasitic diseases: Secondary | ICD-10-CM | POA: Insufficient documentation

## 2015-03-24 DIAGNOSIS — M791 Myalgia: Secondary | ICD-10-CM | POA: Insufficient documentation

## 2015-03-24 DIAGNOSIS — R6883 Chills (without fever): Secondary | ICD-10-CM | POA: Diagnosis present

## 2015-03-24 MED ORDER — GUAIFENESIN-CODEINE 100-10 MG/5ML PO SOLN: 5.0000 mL | Freq: Four times a day (QID) | ORAL | Status: DC | PRN

## 2015-03-24 MED ORDER — OSELTAMIVIR PHOSPHATE 75 MG PO CAPS: 75.0000 mg | ORAL_CAPSULE | Freq: Two times a day (BID) | ORAL | Status: DC

## 2015-03-24 NOTE — ED Provider Notes (Signed)
CSN: [id number]     Arrival date & time 03/24/15  1139 History   First MD Initiated Contact with Patient 03/24/15 1249     Chief Complaint  Patient presents with  . Chills     (Consider location/radiation/quality/duration/timing/severity/associated sxs/prior Treatment) HPI     Kelli Foster is a 25 y.o. female who complains of dry cough, myalgias, headache and chills for 1 days. She denies a history of anorexia, chest pain, dizziness, shortness of breath, vomiting, weakness and wheezing and denies a history of asthma. Patient does not smoke cigarettes.  Denies DOE, SOB, chest tightness or pressure, radiation to left arm, jaw or back, or diaphoresis. Denies dysuria, flank pain, suprapubic pain, frequency, urgency, or hematuria. Denies  light headedness, weakness, visual disturbances. Denies abdominal pain, nausea, vomiting, diarrhea or constipation.       Past Medical History  Diagnosis Date  . Gonorrhea   . Vaginal Pap smear, abnormal    Past Surgical History  Procedure Laterality Date  . No past surgeries     No family history on file. Social History  Substance Use Topics  . Smoking status: Never Smoker   . Smokeless tobacco: None  . Alcohol Use: No   OB History    Gravida Para Term Preterm AB TAB SAB Ectopic Multiple Living   Review of Systems  Ten systems reviewed and are negative for acute change, except as noted in the HPI.    Allergies  Review of patient's allergies indicates no known allergies.  Home Medications   Prior to Admission medications   Not on File   BP 116/72 mmHg  Pulse 72  Temp(Src) 98.6 F (37 C) (Oral)  Resp 16  SpO2 100%  LMP 03/11/2015 Physical Exam Physical Exam  Constitutional: Pt  is oriented to person, place, and time. Appears well-developed and well-nourished. No distress.  HENT:  Head: Normocephalic and atraumatic.  Right Ear: Tympanic membrane, external ear and ear canal normal.  Left Ear: Tympanic  membrane, external ear and ear canal normal.  Nose: Mucosal edema and  rhinorrhea present. No epistaxis. Right sinus exhibits no maxillary sinus tenderness and no frontal sinus tenderness. Left sinus exhibits no maxillary sinus tenderness and no frontal sinus tenderness.  Mouth/Throat: Uvula is midline and mucous membranes are normal. Mucous membranes are not pale and not cyanotic. No oropharyngeal exudate, posterior oropharyngeal edema, posterior oropharyngeal erythema or tonsillar abscesses.  Eyes: Conjunctivae are normal. Pupils are equal, round, and reactive to light.  Neck: Normal range of motion and full passive range of motion without pain.  Cardiovascular: Normal rate and intact distal pulses.   Pulmonary/Chest: Effort normal and breath sounds normal. No stridor.  Clear and equal breath sounds without focal wheezes, rhonchi, rales  Abdominal: Soft. Bowel sounds are normal. There is no tenderness.  Musculoskeletal: Normal range of motion.  Lymphadenopathy:    Pthas no cervical adenopathy.  Neurological: Pt is alert and oriented to person, place, and time.  Skin: Skin is warm and dry. No rash noted. Pt is not diaphoretic.  Psychiatric: Normal mood and affect.  Nursing note and vitals reviewed.   ED Course  Procedures (including critical care time) Labs Review Labs Reviewed - No data to display  Imaging Review No results found. I have personally reviewed and evaluated these images and lab results as part of my medical decision-making.   EKG Interpretation None      MDM  Final diagnoses:  URI (upper respiratory infection)    Pt CXR negative for acute infiltrate. Patients symptoms are consistent with URI, likely viral etiology. Discussed that antibiotics are not indicated for viral infections. Pt will be discharged with symptomatic treatment.  Verbalizes understanding and is agreeable with plan. Pt is hemodynamically stable & in NAD prior to dc.     Arthor Captain,  PA-C 03/24/15 1656  Geoffery Lyons, MD 03/27/15 [phone]

## 2015-03-24 NOTE — Discharge Instructions (Signed)
Upper Respiratory Infection, Adult Most upper respiratory infections (URIs) are a viral infection of the air passages leading to the lungs. A URI affects the nose, throat, and upper air passages. The most common type of URI is nasopharyngitis and is typically referred to as "the common cold." URIs run their course and usually go away on their own. Most of the time, a URI does not require medical attention, but sometimes a bacterial infection in the upper airways can follow a viral infection. This is called a secondary infection. Sinus and middle ear infections are common types of secondary upper respiratory infections. Bacterial pneumonia can also complicate a URI. A URI can worsen asthma and chronic obstructive pulmonary disease (COPD). Sometimes, these complications can require emergency medical care and may be life threatening.  CAUSES Almost all URIs are caused by viruses. A virus is a type of germ and can spread from one person to another.  RISKS FACTORS You may be at risk for a URI if:   You smoke.   You have chronic heart or lung disease.  You have a weakened defense (immune) system.   You are very young or very old.   You have nasal allergies or asthma.  You work in crowded or poorly ventilated areas.  You work in health care facilities or schools. SIGNS AND SYMPTOMS  Symptoms typically develop 2-3 days after you come in contact with a cold virus. Most viral URIs last 7-10 days. However, viral URIs from the influenza virus (flu virus) can last 14-18 days and are typically more severe. Symptoms may include:   Runny or stuffy (congested) nose.   Sneezing.   Cough.   Sore throat.   Headache.   Fatigue.   Fever.   Loss of appetite.   Pain in your forehead, behind your eyes, and over your cheekbones (sinus pain).  Muscle aches.  DIAGNOSIS  Your health care provider may diagnose a URI by:  Physical exam.  Tests to check that your symptoms are not due to  another condition such as:  Strep throat.  Sinusitis.  Pneumonia.  Asthma. TREATMENT  A URI goes away on its own with time. It cannot be cured with medicines, but medicines may be prescribed or recommended to relieve symptoms. Medicines may help:  Reduce your fever.  Reduce your cough.  Relieve nasal congestion. HOME CARE INSTRUCTIONS   Take medicines only as directed by your health care provider.   Gargle warm saltwater or take cough drops to comfort your throat as directed by your health care provider.  Use a warm mist humidifier or inhale steam from a shower to increase air moisture. This may make it easier to breathe.  Drink enough fluid to keep your urine clear or pale yellow.   Eat soups and other clear broths and maintain good nutrition.   Rest as needed.   Return to work when your temperature has returned to normal or as your health care provider advises. You may need to stay home longer to avoid infecting others. You can also use a face mask and careful hand washing to prevent spread of the virus.  Increase the usage of your inhaler if you have asthma.   Do not use any tobacco products, including cigarettes, chewing tobacco, or electronic cigarettes. If you need help quitting, ask your health care provider. PREVENTION  The best way to protect yourself from getting a cold is to practice good hygiene.   Avoid oral or hand contact with people with cold  symptoms.   Wash your hands often if contact occurs.  There is no clear evidence that vitamin C, vitamin E, echinacea, or exercise reduces the chance of developing a cold. However, it is always recommended to get plenty of rest, exercise, and practice good nutrition.  SEEK MEDICAL CARE IF:   You are getting worse rather than better.   Your symptoms are not controlled by medicine.   You have chills.  You have worsening shortness of breath.  You have brown or red mucus.  You have yellow or brown nasal  discharge.  You have pain in your face, especially when you bend forward.  You have a fever.  You have swollen neck glands.  You have pain while swallowing.  You have white areas in the back of your throat. SEEK IMMEDIATE MEDICAL CARE IF:   You have severe or persistent:  Headache.  Ear pain.  Sinus pain.  Chest pain.  You have chronic lung disease and any of the following:  Wheezing.  Prolonged cough.  Coughing up blood.  A change in your usual mucus.  You have a stiff neck.  You have changes in your:  Vision.  Hearing.  Thinking.  Mood. MAKE SURE YOU:   Understand these instructions.  Will watch your condition.  Will get help right away if you are not doing well or get worse.   This information is not intended to replace advice given to you by your health care provider. Make sure you discuss any questions you have with your health care provider.   Document Released: 07/18/2000 Document Revised: 06/08/2014 Document Reviewed: 04/29/2013 Elsevier Interactive Patient Education 2016 [employer] Influenza Tests WHY AM I HAVING THIS TEST? You may have an influenza test to help your health care provider determine what type of respiratory infection you have. The test may also be used to help determine a treatment plan and to monitor influenza activity within a community. There are two types of influenza virus: types A and B. Often, one strain of type A influenza will be the most common type of influenza in a community during flu season. This is typically between the months of October and May. Influenza tests can help determine which strain of influenza type A is occurring most often in the community. WHAT KIND OF SAMPLE IS TAKEN? Influenza tests are performed by collecting a small sample of fluids (secretions) from your nose or throat using a cotton swab. Tests performed on nasal secretions are more accurate than tests performed on a sample taken from your  throat.  Rapid influenza tests are available and have become the most frequently used tests for influenza. They are most accurate when completed within the first 48 hours after your symptoms begin.  Depending on the method, a rapid influenza test may be completed in your health care provider's office in less than 30 minutes. It can also be sent to a lab with the results available the same day.  Depending on the particular type of test used, it can identify influenza type A, a mixture of types A and B, or differentiate between type A and B.  Another test that your health care provider may order is a viral culture. This also requires the collection of secretions from your nose or throat. The sample is then sent to a lab for processing. This may take several days to complete. HOW ARE YOUR TEST RESULTS REPORTED? Your test results will be reported as either positive or negative. A false-negative result can  occur. A false-negative result is incorrect because it indicates a condition or finding is not present when it is. It is your responsibility to obtain your test results. Ask the lab or department performing the test when and how you will get your results. WHAT DO THE RESULTS MEAN?  A positive test means you have influenza. Tests may further determine the type of influenza you have.  A negative influenza test result means it is not likely that you have influenza.  A false-negative result can occur. False-negative results are more likely to happen at the height of the influenza season. Talk with your health care provider to discuss your results, treatment options, and if necessary, the need for more tests. Talk with your health care provider if you have any questions about your results.   This information is not intended to replace advice given to you by your health care provider. Make sure you discuss any questions you have with your health care provider.   Document Released: 11/01/2004 Document  Revised: 02/12/2014 Document Reviewed: 06/10/2013 Elsevier Interactive Patient Education [employer]

## 2015-03-24 NOTE — ED Notes (Signed)
C/o chills, HA-started yesterday-NAD_steady gait

## 2015-05-30 ENCOUNTER — Emergency Department (HOSPITAL_BASED_OUTPATIENT_CLINIC_OR_DEPARTMENT_OTHER)
Admission: EM | Admit: 2015-05-30 | Discharge: 2015-05-30 | Disposition: A | Discharge status: Home / Self Care | Payer: Medicaid Other | Attending: Emergency Medicine | Admitting: Emergency Medicine

## 2015-05-30 ENCOUNTER — Encounter (HOSPITAL_BASED_OUTPATIENT_CLINIC_OR_DEPARTMENT_OTHER): Payer: Self-pay | Admitting: *Deleted

## 2015-05-30 DIAGNOSIS — Z79899 Other long term (current) drug therapy: Secondary | ICD-10-CM | POA: Diagnosis not present

## 2015-05-30 DIAGNOSIS — N72 Inflammatory disease of cervix uteri: Secondary | ICD-10-CM | POA: Diagnosis not present

## 2015-05-30 DIAGNOSIS — Z8619 Personal history of other infectious and parasitic diseases: Secondary | ICD-10-CM | POA: Insufficient documentation

## 2015-05-30 DIAGNOSIS — R1030 Lower abdominal pain, unspecified: Secondary | ICD-10-CM | POA: Diagnosis present

## 2015-05-30 DIAGNOSIS — Z3202 Encounter for pregnancy test, result negative: Secondary | ICD-10-CM | POA: Insufficient documentation

## 2015-05-30 LAB — URINALYSIS, ROUTINE W REFLEX MICROSCOPIC
BILIRUBIN URINE: NEGATIVE
Glucose, UA: NEGATIVE mg/dL
HGB URINE DIPSTICK: NEGATIVE
KETONES UR: NEGATIVE mg/dL
Nitrite: NEGATIVE
Protein, ur: NEGATIVE mg/dL
SPECIFIC GRAVITY, URINE: 1.022 (ref 1.005–1.030)
pH: 7.5 (ref 5.0–8.0)

## 2015-05-30 LAB — URINE MICROSCOPIC-ADD ON

## 2015-05-30 LAB — PREGNANCY, URINE: Preg Test, Ur: NEGATIVE

## 2015-05-30 LAB — WET PREP, GENITAL
SPERM: NONE SEEN
Yeast Wet Prep HPF POC: NONE SEEN

## 2015-05-30 MED ORDER — LIDOCAINE HCL (PF) 1 % IJ SOLN
INTRAMUSCULAR | Status: AC
  Administered 2015-05-30: 0.9 mL
  Filled 2015-05-30: qty 5

## 2015-05-30 MED ORDER — CEFTRIAXONE SODIUM 250 MG IJ SOLR
250.0000 mg | Freq: Once | INTRAMUSCULAR | Status: AC
  Administered 2015-05-30: 250 mg via INTRAMUSCULAR
  Filled 2015-05-30: qty 250

## 2015-05-30 MED ORDER — AZITHROMYCIN 250 MG PO TABS
1000.0000 mg | ORAL_TABLET | Freq: Once | ORAL | Status: AC
  Administered 2015-05-30: 1000 mg via ORAL
  Filled 2015-05-30: qty 4

## 2015-05-30 MED ORDER — METRONIDAZOLE 500 MG PO TABS
2000.0000 mg | ORAL_TABLET | Freq: Once | ORAL | Status: AC
  Administered 2015-05-30: 2000 mg via ORAL
  Filled 2015-05-30: qty 4

## 2015-05-30 NOTE — ED Provider Notes (Signed)
CSN: [id number]     Arrival date & time 05/30/15  1227 History   First MD Initiated Contact with Patient 05/30/15 1245     Chief Complaint  Patient presents with  . Abdominal Pain     (Consider location/radiation/quality/duration/timing/severity/associated sxs/prior Treatment) HPI Kelli Foster is a 25 y.o. female with a history of STI, presents emergency department complaining of vaginal discharge. Patient states discharge started approximately 3 days ago. She reports unprotected intercourse. She reports lower abdominal cramping. Denies urinary symptoms. Denies fever or chills. No nausea or vomiting. No flank pain. Does not think that she is pregnant.  Past Medical History  Diagnosis Date  . Gonorrhea   . Vaginal Pap smear, abnormal    Past Surgical History  Procedure Laterality Date  . No past surgeries     No family history on file. Social History  Substance Use Topics  . Smoking status: Never Smoker   . Smokeless tobacco: None  . Alcohol Use: No   OB History    Gravida Para Term Preterm AB TAB SAB Ectopic Multiple Living   3 1 1  2 2    1      Review of Systems  Constitutional: Negative for fever and chills.  Respiratory: Negative for cough, chest tightness and shortness of breath.   Cardiovascular: Negative for chest pain, palpitations and leg swelling.  Gastrointestinal: Positive for abdominal pain. Negative for nausea, vomiting and diarrhea.  Genitourinary: Positive for vaginal discharge and pelvic pain. Negative for dysuria, flank pain, vaginal bleeding and vaginal pain.  Musculoskeletal: Negative for myalgias, arthralgias, neck pain and neck stiffness.  Skin: Negative for rash.  Neurological: Negative for dizziness, weakness and headaches.  All other systems reviewed and are negative.     Allergies  Review of patient's allergies indicates no known allergies.  Home Medications   Prior to Admission medications   Medication Sig Start Date End Date Taking?  Authorizing Provider  guaiFENesin-codeine 100-10 MG/5ML syrup Take 5-10 mLs by mouth every 6 (six) hours as needed for cough. 03/24/15   Arthor CaptainAbigail Harris, PA-C  oseltamivir (TAMIFLU) 75 MG capsule Take 1 capsule (75 mg total) by mouth 2 (two) times daily. 03/24/15   Abigail Harris, PA-C   BP 106/61 mmHg  Pulse 69  Temp(Src) 98.1 F (36.7 C) (Oral)  Resp 18  Ht 5' 6.5" (1.689 m)  Wt 69.854 kg  BMI 24.49 kg/m2  SpO2 100%  LMP 05/13/2015 Physical Exam  Constitutional: She is oriented to person, place, and time. She appears well-developed and well-nourished. No distress.  HENT:  Head: Normocephalic.  Eyes: Conjunctivae are normal.  Neck: Neck supple.  Cardiovascular: Normal rate, regular rhythm and normal heart sounds.   Pulmonary/Chest: Effort normal and breath sounds normal. No respiratory distress. She has no wheezes. She has no rales.  Abdominal: Soft. Bowel sounds are normal. She exhibits no distension. There is no tenderness. There is no rebound.  Genitourinary:  White frothy vaginal discharge. External genitalia normal. Cervix erythematous. Positive cervical motion tenderness. No tenderness over her uterus, no tenderness over bilateral adnexa.  Musculoskeletal: She exhibits no edema.  Neurological: She is alert and oriented to person, place, and time.  Skin: Skin is warm and dry.  Psychiatric: She has a normal mood and affect. Her behavior is normal.  Nursing note and vitals reviewed.   ED Course  Procedures (including critical care time) Labs Review Labs Reviewed  WET PREP, GENITAL - Abnormal; Notable for the following:    Trich, Wet Prep PRESENT (*)  Clue Cells Wet Prep HPF POC PRESENT (*)    WBC, Wet Prep HPF POC MANY (*)    All other components within normal limits  URINALYSIS, ROUTINE W REFLEX MICROSCOPIC (NOT AT [facility]) - Abnormal; Notable for the following:    Leukocytes, UA TRACE (*)    All other components within normal limits  URINE MICROSCOPIC-ADD ON - Abnormal;  Notable for the following:    Squamous Epithelial / LPF 0-5 (*)    Bacteria, UA RARE (*)    All other components within normal limits  PREGNANCY, URINE  GC/CHLAMYDIA PROBE AMP ([place]) NOT AT [facility]    Imaging Review No results found. I have personally reviewed and evaluated these images and lab results as part of my medical decision-making.   EKG Interpretation None      MDM   Final diagnoses:  Cervicitis    Patient emergency department complaining of lower abdominal pain and vaginal discharge. Has history of STD, feels the same. Has had an unprotected intercourse. Pelvic exam shows frothy white discharge, positive cervical motion tenderness. No abdominal tenderness or suprapubic tenderness. No adnexal tenderness. She is afebrile, nontoxic appearing. Will treat for cervicitis. At this point I do not think patient has PID given no uterine tenderness. Wet prep shows Trichomonas, covered with 2 g of Flagyl, 1 g of Zithromax, 250 mg of Rocephin IM. Discussed results with patient. No intercourse for one week. Make sure partners treated as well. Cultures are pending. Patient agrees with the plan.  Filed Vitals:   05/30/15 1238 05/30/15 1442  BP: 97/66 106/61  Pulse: 68 69  Temp: 98.1 F (36.7 C)   TempSrc: Oral   Resp: 18 18  Height: 5' 6.5" (1.689 m)   Weight: 69.854 kg   SpO2: 100% 100%     Jaynie Crumble, PA-C 05/30/15 1516  Jaynie Crumble, PA-C 05/30/15 1519  Gwyneth Sprout, MD 05/30/15 1635

## 2015-05-30 NOTE — Discharge Instructions (Signed)
You were treated today for trichomonas, and possible gonorrhea and chlamydia. In no intercourse for one week. Make sure partners treated. Follow-up with health department as needed  Trichomoniasis Trichomoniasis is an infection caused by an organism called Trichomonas. The infection can affect both women and men. In women, the outer female genitalia and the vagina are affected. In men, the penis is mainly affected, but the prostate and other reproductive organs can also be involved. Trichomoniasis is a sexually transmitted infection (STI) and is most often passed to another person through sexual contact.  RISK FACTORS  Having unprotected sexual intercourse.  Having sexual intercourse with an infected partner. SIGNS AND SYMPTOMS  Symptoms of trichomoniasis in women include:  Abnormal gray-green frothy vaginal discharge.  Itching and irritation of the vagina.  Itching and irritation of the area outside the vagina. Symptoms of trichomoniasis in men include:   Penile discharge with or without pain.  Pain during urination. This results from inflammation of the urethra. DIAGNOSIS  Trichomoniasis may be found during a Pap test or physical exam. Your health care provider may use one of the following methods to help diagnose this infection:  Testing the pH of the vagina with a test tape.  Using a vaginal swab test that checks for the Trichomonas organism. A test is available that provides results within a few minutes.  Examining a urine sample.  Testing vaginal secretions. Your health care provider may test you for other STIs, including HIV. TREATMENT   You may be given medicine to fight the infection. Women should inform their health care provider if they could be or are pregnant. Some medicines used to treat the infection should not be taken during pregnancy.  Your health care provider may recommend over-the-counter medicines or creams to decrease itching or irritation.  Your sexual  partner will need to be treated if infected.  Your health care provider may test you for infection again 3 months after treatment. HOME CARE INSTRUCTIONS   Take medicines only as directed by your health care provider.  Take over-the-counter medicine for itching or irritation as directed by your health care provider.  Do not have sexual intercourse while you have the infection.  Women should not douche or wear tampons while they have the infection.  Discuss your infection with your partner. Your partner may have gotten the infection from you, or you may have gotten it from your partner.  Have your sex partner get examined and treated if necessary.  Practice safe, informed, and protected sex.  See your health care provider for other STI testing. SEEK MEDICAL CARE IF:   You still have symptoms after you finish your medicine.  You develop abdominal pain.  You have pain when you urinate.  You have bleeding after sexual intercourse.  You develop a rash.  Your medicine makes you sick or makes you throw up (vomit). MAKE SURE YOU:  Understand these instructions.  Will watch your condition.  Will get help right away if you are not doing well or get worse.   This information is not intended to replace advice given to you by your health care provider. Make sure you discuss any questions you have with your health care provider.   Document Released: 07/18/2000 Document Revised: 02/12/2014 Document Reviewed: 11/03/2012 Elsevier Interactive Patient Education 2016 Elsevier Inc[employer]    Cervicitis Cervicitis is a soreness and swelling (inflammation) of the cervix. Your cervix is located at the bottom of your uterus. It opens up to the vagina. CAUSES  Sexually transmitted infections (STIs).   Allergic reaction.   Medicines or birth control devices that are put in the vagina.   Injury to the cervix.   Bacterial infections.  RISK FACTORS You are at greater risk if  you:  Have unprotected sexual intercourse.  Have sexual intercourse with many partners.  Began sexual intercourse at an early age.  Have a history of STIs. SYMPTOMS  There may be no symptoms. If symptoms occur, they may include:   Gray, white, yellow, or bad-smelling vaginal discharge.   Pain or itching of the area outside the vagina.   Painful sexual intercourse.   Lower abdominal or lower back pain, especially during intercourse.   Frequent urination.   Abnormal vaginal bleeding between periods, after sexual intercourse, or after menopause.   Pressure or a heavy feeling in the pelvis.  DIAGNOSIS  Diagnosis is made after a pelvic exam. Other tests may include:   Examination of any discharge under a microscope (wet prep).   A Pap test.  TREATMENT  Treatment will depend on the cause of cervicitis. If it is caused by an STI, both you and your partner will need to be treated. Antibiotic medicines will be given.  HOME CARE INSTRUCTIONS   Do not have sexual intercourse until your health care provider says it is okay.   Do not have sexual intercourse until your partner has been treated, if your cervicitis is caused by an STI.   Take your antibiotics as directed. Finish them even if you start to feel better.  SEEK MEDICAL CARE IF:  Your symptoms come back.   You have a fever.  MAKE SURE YOU:   Understand these instructions.  Will watch your condition.  Will get help right away if you are not doing well or get worse.   This information is not intended to replace advice given to you by your health care provider. Make sure you discuss any questions you have with your health care provider.   Document Released: 01/22/2005 Document Revised: 01/27/2013 Document Reviewed: 07/16/2012 Elsevier Interactive Patient Education [employer] Elsevier Inc.

## 2015-05-30 NOTE — ED Notes (Signed)
Lower abdominal pain. Vaginal discharge.  

## 2015-05-31 LAB — GC/CHLAMYDIA PROBE AMP (~~LOC~~) NOT AT ARMC
Chlamydia: POSITIVE — AB
NEISSERIA GONORRHEA: NEGATIVE

## 2015-06-01 ENCOUNTER — Telehealth (HOSPITAL_BASED_OUTPATIENT_CLINIC_OR_DEPARTMENT_OTHER): Payer: Self-pay | Admitting: Emergency Medicine

## 2015-07-15 ENCOUNTER — Emergency Department (HOSPITAL_BASED_OUTPATIENT_CLINIC_OR_DEPARTMENT_OTHER)
Admission: EM | Admit: 2015-07-15 | Discharge: 2015-07-15 | Disposition: A | Discharge status: Home / Self Care | Payer: Medicaid Other | Attending: Emergency Medicine | Admitting: Emergency Medicine

## 2015-07-15 ENCOUNTER — Encounter (HOSPITAL_BASED_OUTPATIENT_CLINIC_OR_DEPARTMENT_OTHER): Payer: Self-pay | Admitting: *Deleted

## 2015-07-15 DIAGNOSIS — N939 Abnormal uterine and vaginal bleeding, unspecified: Secondary | ICD-10-CM | POA: Insufficient documentation

## 2015-07-15 LAB — WET PREP, GENITAL
Sperm: NONE SEEN
TRICH WET PREP: NONE SEEN
YEAST WET PREP: NONE SEEN

## 2015-07-15 LAB — URINALYSIS, ROUTINE W REFLEX MICROSCOPIC
Bilirubin Urine: NEGATIVE
Glucose, UA: NEGATIVE mg/dL
Ketones, ur: NEGATIVE mg/dL
LEUKOCYTES UA: NEGATIVE
NITRITE: NEGATIVE
PROTEIN: NEGATIVE mg/dL
Specific Gravity, Urine: 1.029 (ref 1.005–1.030)
pH: 6 (ref 5.0–8.0)

## 2015-07-15 LAB — PREGNANCY, URINE: Preg Test, Ur: NEGATIVE

## 2015-07-15 LAB — URINE MICROSCOPIC-ADD ON

## 2015-07-15 NOTE — Discharge Instructions (Signed)
Return to the ED with any concerns including bleeding and soaking more than one pad per hour, fainting, fever, vomiting and not able to keep down liquids, decreased level of alertness/lethargy, or any other alarming symptoms

## 2015-07-15 NOTE — ED Provider Notes (Signed)
CSN: [id number]     Arrival date & time 07/15/15  0905 History   First MD Initiated Contact with Patient 07/15/15 [phone]     Chief Complaint  Patient presents with  . Vaginal Bleeding     (Consider location/radiation/quality/duration/timing/severity/associated sxs/prior Treatment) HPI  Pt presenting with c/o vaginal bleeding.  States her period came several days late- she had some spotting earlier in the week, then this morning noted clots of blood.  No abdominal pain.  No fever/chills.  She is concerned she may be pregnant.  No lightheaded or dizziness.  She has used 2 pads this morning.  There are no other associated systemic symptoms, there are no other alleviating or modifying factors.   Past Medical History  Diagnosis Date  . Gonorrhea   . Vaginal Pap smear, abnormal    Past Surgical History  Procedure Laterality Date  . No past surgeries     History reviewed. No pertinent family history. Social History  Substance Use Topics  . Smoking status: Never Smoker   . Smokeless tobacco: None  . Alcohol Use: No   OB History    Gravida Para Term Preterm AB TAB SAB Ectopic Multiple Living   3 1 1  2 2    1      Review of Systems  ROS reviewed and all otherwise negative except for mentioned in HPI    Allergies  Review of patient's allergies indicates no known allergies.  Home Medications   Prior to Admission medications   Not on File   BP 104/45 mmHg  Pulse 72  Temp(Src) 98.2 F (36.8 C) (Oral)  Resp 16  Ht 5\' 6"  (1.676 m)  Wt 160 lb (72.576 kg)  BMI 25.84 kg/m2  SpO2 98%  LMP 07/11/2015  Vitals reviewed Physical Exam  Physical Examination: General appearance - alert, well appearing, and in no distress Mental status - alert, oriented to person, place, and time Eyes - no conjunctival injection no scleral icterus Mouth - mucous membranes moist, pharynx normal without lesions Chest - clear to auscultation, no wheezes, rales or rhonchi, symmetric air entry Heart -  normal rate, regular rhythm, normal S1, S2, no murmurs, rubs, clicks or gallops Abdomen - soft, nontender, nondistended, no masses or organomegaly Pelvic - normal external genitalia, vulva, vagina, cervix, uterus and adnexa, mild amount of vaginal bleeding, no CMT or adnexal tenderness Neurological - alert, oriented, normal speech Extremities - peripheral pulses normal, no pedal edema, no clubbing or cyanosis Skin - normal coloration and turgor, no rashes  ED Course  Procedures (including critical care time) Labs Review Labs Reviewed  WET PREP, GENITAL - Abnormal; Notable for the following:    Clue Cells Wet Prep HPF POC PRESENT (*)    WBC, Wet Prep HPF POC MANY (*)    All other components within normal limits  URINALYSIS, ROUTINE W REFLEX MICROSCOPIC (NOT AT [facility]) - Abnormal; Notable for the following:    Hgb urine dipstick MODERATE (*)    All other components within normal limits  URINE MICROSCOPIC-ADD ON - Abnormal; Notable for the following:    Squamous Epithelial / LPF 0-5 (*)    Bacteria, UA FEW (*)    All other components within normal limits  PREGNANCY, URINE  GC/CHLAMYDIA PROBE AMP ([place]) NOT AT [facility]    Imaging Review No results found. I have personally reviewed and evaluated these images and lab results as part of my medical decision-making.   EKG Interpretation None      MDM  Final diagnoses:  Vaginal bleeding    Pt presenting with c/o vaginal bleeding, concerned she is pregnant.  Pregnancy test negative in the ED.  Pelvic exam is reassuring.  Suspect this is her menses and heavier than usual as she is several days late.  No abdominal pain.  Wet prep reassuring.  Advised f/u with gynecology. Discharged with strict return precautions.  Pt agreeable with plan.    Jerelyn Scott, MD 07/15/15 [phone]

## 2015-07-15 NOTE — ED Notes (Signed)
Pt amb to room 7 with quick steady gait in nad. Pt reports onset of her period on Monday, light bleeding, this am had a "gush of blood clots". Pt took preg test Monday and "it wasn't very dark, so it made me wonder..." pt is a/a/ox4, smiling in nad. Pt reports using 2 pads this am.

## 2015-07-18 LAB — GC/CHLAMYDIA PROBE AMP (~~LOC~~) NOT AT ARMC
Chlamydia: NEGATIVE
Neisseria Gonorrhea: NEGATIVE

## 2015-09-19 ENCOUNTER — Encounter (HOSPITAL_BASED_OUTPATIENT_CLINIC_OR_DEPARTMENT_OTHER): Payer: Self-pay | Admitting: *Deleted

## 2015-09-19 ENCOUNTER — Emergency Department (HOSPITAL_BASED_OUTPATIENT_CLINIC_OR_DEPARTMENT_OTHER)
Admission: EM | Admit: 2015-09-19 | Discharge: 2015-09-19 | Disposition: A | Discharge status: Home / Self Care | Payer: Medicaid Other | Attending: Emergency Medicine | Admitting: Emergency Medicine

## 2015-09-19 DIAGNOSIS — H6092 Unspecified otitis externa, left ear: Secondary | ICD-10-CM | POA: Insufficient documentation

## 2015-09-19 DIAGNOSIS — J029 Acute pharyngitis, unspecified: Secondary | ICD-10-CM | POA: Insufficient documentation

## 2015-09-19 LAB — RAPID STREP SCREEN (MED CTR MEBANE ONLY): STREPTOCOCCUS, GROUP A SCREEN (DIRECT): NEGATIVE

## 2015-09-19 MED ORDER — CIPROFLOXACIN-DEXAMETHASONE 0.3-0.1 % OT SUSP
4.0000 [drp] | Freq: Once | OTIC | Status: AC
  Administered 2015-09-19: 4 [drp] via OTIC
  Filled 2015-09-19: qty 7.5

## 2015-09-19 NOTE — ED Provider Notes (Signed)
[facility]-EMERGENCY DEPT Provider Note   CSN: [id number] Arrival date & time: 09/19/15  1052     History   Chief Complaint No chief complaint on file.   HPI Kelli Foster is a 25 y.o. female.  HPI  Pt presenting with c/o sore throat and left ear pain.  She states symptoms began last night.  Sore throat is better now but left ear pain is worse.  Sharp pains in ear.  No drainage.  Hearing is normal.  No fever.  No significant nasal congestion or cough.  She took ibuprofen prior to arrival which did not help much with the pain.  Pain is constant.  There are no other associated systemic symptoms, there are no other alleviating or modifying factors.   Past Medical History:  Diagnosis Date  . Gonorrhea   . Vaginal Pap smear, abnormal     Patient Active Problem List   Diagnosis Date Noted  . Labor and delivery, indication for care 04/28/2013  . Supervision of other normal pregnancy 04/28/2013    Past Surgical History:  Procedure Laterality Date  . NO PAST SURGERIES      OB History    Gravida Para Term Preterm AB Living   3 1 1   2 1    SAB TAB Ectopic Multiple Live Births     2     1       Home Medications    Prior to Admission medications   Not on File    Family History No family history on file.  Social History Social History  Substance Use Topics  . Smoking status: Never Smoker  . Smokeless tobacco: Never Used  . Alcohol use No     Allergies   Review of patient's allergies indicates no known allergies.   Review of Systems Review of Systems ROS reviewed and all otherwise negative except for mentioned in HPI  Physical Exam Updated Vital Signs BP 127/75   Pulse 96   Temp 99.1 F (37.3 C) (Oral)   Resp 20   Ht 5\' 6"  (1.676 m)   Wt 76.7 kg   LMP 09/16/2015   SpO2 100%   BMI 27.28 kg/m  Vitals reviewed Physical Exam Physical Examination: General appearance - alert, well appearing, and in no distress Mental status - alert, oriented to person,  place, and time Eyes - no conjunctival injection, no scleral icterus Ears - left EAC with debris and inflammation, pain with movement of the pinna, right TM and EAC are normal Mouth - mucous membranes moist, pharynx normal without lesions Neck - supple, no significant adenopathy Chest - clear to auscultation, no wheezes, rales or rhonchi, symmetric air entry Heart - normal rate, regular rhythm, normal S1, S2, no murmurs, rubs, clicks or gallops Neurological - alert, oriented, normal speech Extremities - peripheral pulses normal, no pedal edema, no clubbing or cyanosis Skin - normal coloration and turgor, no rashes  ED Treatments / Results  Labs (all labs ordered are listed, but only abnormal results are displayed) Labs Reviewed  RAPID STREP SCREEN (NOT AT [facility])  CULTURE, GROUP A STREP [facility]    EKG  EKG Interpretation None       Radiology No results found.  Procedures Procedures (including critical care time)  Medications Ordered in ED Medications  ciprofloxacin-dexamethasone (CIPRODEX) 0.3-0.1 % otic suspension 4 drop (4 drops Left Ear Given 09/19/15 1349)     Initial Impression / Assessment and Plan / ED Course  I have reviewed the triage vital signs and  the nursing notes.  Pertinent labs & imaging results that were available during my care of the patient were reviewed by me and considered in my medical decision making (see chart for details).  Clinical Course    Pt presenting with c/o left ear pain and sore throat.  On exam she has evidence of left otitis externa- pt given cipro dex drops to start in the ED.  Rapid strep was obtained in triage and it was negative.  Advised ibuprofen for pain.  Discharged with strict return precautions.  Pt agreeable with plan.  Final Clinical Impressions(s) / ED Diagnoses   Final diagnoses:  Viral pharyngitis  Left otitis externa    New Prescriptions There are no discharge medications for this patient.    Jerelyn ScottMartha Linker,  MD 09/20/15 1017

## 2015-09-19 NOTE — Discharge Instructions (Signed)
Return to the ED with any concerns including difficulty breathing or swallowing, headache, fever, vomiting, decreased level of alertness/lethargy, or any other alarming symptoms  You should use ciprodex drops as follows:  4 drops in left ear twice a day

## 2015-09-19 NOTE — ED Triage Notes (Signed)
Throat and left ear pain. Hx of strep in the past. Fever. She took Ibuprofen 45 minutes ago.

## 2015-09-21 LAB — CULTURE, GROUP A STREP (THRC)

## 2021-04-24 ENCOUNTER — Other Ambulatory Visit (HOSPITAL_BASED_OUTPATIENT_CLINIC_OR_DEPARTMENT_OTHER): Payer: Self-pay

## 2021-04-24 ENCOUNTER — Emergency Department (HOSPITAL_BASED_OUTPATIENT_CLINIC_OR_DEPARTMENT_OTHER): Payer: Medicaid Other

## 2021-04-24 ENCOUNTER — Encounter (HOSPITAL_BASED_OUTPATIENT_CLINIC_OR_DEPARTMENT_OTHER): Payer: Self-pay | Admitting: Emergency Medicine

## 2021-04-24 ENCOUNTER — Other Ambulatory Visit: Payer: Self-pay

## 2021-04-24 ENCOUNTER — Emergency Department (HOSPITAL_BASED_OUTPATIENT_CLINIC_OR_DEPARTMENT_OTHER)
Admission: EM | Admit: 2021-04-24 | Discharge: 2021-04-24 | Disposition: A | Discharge status: Home / Self Care | Payer: Medicaid Other | Attending: Emergency Medicine | Admitting: Emergency Medicine

## 2021-04-24 DIAGNOSIS — R0789 Other chest pain: Secondary | ICD-10-CM | POA: Diagnosis not present

## 2021-04-24 DIAGNOSIS — R0602 Shortness of breath: Secondary | ICD-10-CM | POA: Diagnosis present

## 2021-04-24 LAB — CBC
HCT: 37.1 % (ref 36.0–46.0)
Hemoglobin: 12.9 g/dL (ref 12.0–15.0)
MCH: 33.2 pg (ref 26.0–34.0)
MCHC: 34.8 g/dL (ref 30.0–36.0)
MCV: 95.4 fL (ref 80.0–100.0)
Platelets: 272 10*3/uL (ref 150–400)
RBC: 3.89 MIL/uL (ref 3.87–5.11)
RDW: 12.7 % (ref 11.5–15.5)
WBC: 6.5 10*3/uL (ref 4.0–10.5)
nRBC: 0 % (ref 0.0–0.2)

## 2021-04-24 LAB — BASIC METABOLIC PANEL
Anion gap: 9 (ref 5–15)
BUN: 14 mg/dL (ref 6–20)
CO2: 25 mmol/L (ref 22–32)
Calcium: 9 mg/dL (ref 8.9–10.3)
Chloride: 104 mmol/L (ref 98–111)
Creatinine, Ser: 0.91 mg/dL (ref 0.44–1.00)
GFR, Estimated: >60 mL/min (ref >60)
Glucose, Bld: 113 mg/dL — ABNORMAL HIGH (ref 70–99)
Potassium: 3.7 mmol/L (ref 3.5–5.1)
Sodium: 138 mmol/L (ref 135–145)

## 2021-04-24 LAB — D-DIMER, QUANTITATIVE: D-Dimer, Quant: <0.27 ug/mL-FEU (ref 0.00–0.50)

## 2021-04-24 LAB — PREGNANCY, URINE: Preg Test, Ur: NEGATIVE

## 2021-04-24 LAB — TROPONIN I (HIGH SENSITIVITY): Troponin I (High Sensitivity): <2 ng/L (ref <18)

## 2021-04-24 MED ORDER — NAPROXEN 500 MG PO TABS
500.0000 mg | ORAL_TABLET | Freq: Two times a day (BID) | ORAL | 0 refills | Status: AC
  Filled 2021-04-24: qty 20, 10d supply, fill #0

## 2021-04-24 NOTE — ED Triage Notes (Signed)
Pt arrives pov, steady gait to triage, c/o non-radiating CP x 4 days, shob  today. Pt denies cough or fever. Movement increases pain ?

## 2021-04-24 NOTE — Discharge Instructions (Addendum)
Take naproxen as prescribed with food for 10 days.  Discontinue use if you develop abdominal pain.  Recheck with your primary care provider. ?

## 2021-04-24 NOTE — ED Provider Notes (Signed)
?[facility] ?Provider Note ? ? ?CSN: [id number] ?Arrival date & time: 04/24/21  [id number] ? ?  ? ?History ? ?Chief Complaint  ?Patient presents with  ? Chest Pain  ? ? ?Kelli Foster is a 31 y.o. female. ? ?31 year old female with no significant past medical history, non-smoker (does vape), presents with complaint of pain in her chest and shortness of breath.  Patient noted chest pain, midsternal onset Friday (04/21/2021), has been intermittent for the past few days.  Described as a pressure sensation which occurs if she has certain movements, laughs, moves her chin to her chest.  Otherwise is pain-free.  Patient began complaining of shortness of breath today which prompted her to seek emergency evaluation.  Shortness of breath is worse with exertion or with talking although she is able to hold a full conversation.  She denies any associated nausea, vomiting, abdominal pain, diaphoresis.  No history of recent extended travel, cancer, personal or family history of PE or DVT, not on OCP. ? ? ?  ? ?Home Medications ?Prior to Admission medications   ?Medication Sig Start Date End Date Taking? Authorizing Provider  ?naproxen (NAPROSYN) 500 MG tablet Take 1 tablet (500 mg total) by mouth 2 (two) times daily for 10 days. 04/24/21 05/04/21 Yes Tacy Learn, PA-C  ?   ? ?Allergies    ?Patient has no known allergies.   ? ?Review of Systems   ?Review of Systems ?Negative except as per HPI ?Physical Exam ?Updated Vital Signs ?BP (!) 119/93   Pulse (!) 57   Temp 98.2 ?F (36.8 ?C) (Oral)   Resp 13   Ht 5\' 6"  (1.676 m)   Wt 109.8 kg   LMP 04/09/2021   SpO2 100%   BMI 39.06 kg/m?  ?Physical Exam ?Vitals and nursing note reviewed.  ?Constitutional:   ?   General: She is not in acute distress. ?   Appearance: She is well-developed. She is not diaphoretic.  ?HENT:  ?   Head: Normocephalic and atraumatic.  ?Cardiovascular:  ?   Rate and Rhythm: Normal rate and regular rhythm.  ?   Heart sounds: Normal  heart sounds. No murmur heard. ?Pulmonary:  ?   Effort: Pulmonary effort is normal.  ?   Breath sounds: Normal breath sounds. No decreased breath sounds.  ?Chest:  ?   Chest wall: Tenderness present.  ? ? ?Abdominal:  ?   Palpations: Abdomen is soft.  ?   Tenderness: There is no abdominal tenderness.  ?Musculoskeletal:  ?   Right lower leg: No edema.  ?   Left lower leg: No edema.  ?Skin: ?   General: Skin is warm and dry.  ?   Findings: No erythema or rash.  ?Neurological:  ?   Mental Status: She is alert and oriented to person, place, and time.  ?Psychiatric:     ?   Behavior: Behavior normal.  ? ? ?ED Results / Procedures / Treatments   ?Labs ?(all labs ordered are listed, but only abnormal results are displayed) ?Labs Reviewed  ?BASIC METABOLIC PANEL - Abnormal; Notable for the following components:  ?    Result Value  ? Glucose, Bld 113 (*)   ? All other components within normal limits  ?CBC  ?PREGNANCY, URINE  ?D-DIMER, QUANTITATIVE  ?TROPONIN I (HIGH SENSITIVITY)  ? ? ?EKG ?None ? ?Radiology ?DG Chest 2 View ? ?Result Date: 04/24/2021 ?CLINICAL DATA:  31 year old female with history of chest pain, heaviness and shortness of breath.  EXAM: CHEST - 2 VIEW COMPARISON:  No priors. FINDINGS: Lung volumes are normal. No consolidative airspace disease. No pleural effusions. No pneumothorax. No pulmonary nodule or mass noted. Pulmonary vasculature and the cardiomediastinal silhouette are within normal limits. IMPRESSION: No radiographic evidence of acute cardiopulmonary disease. Electronically Signed   By: Vinnie Langton M.D.   On: 04/24/2021 10:05   ? ?Procedures ?Procedures  ? ? ?Medications Ordered in ED ?Medications - No data to display ? ?ED Course/ Medical Decision Making/ A&P ?  ?                        ?Medical Decision Making ?Amount and/or Complexity of Data Reviewed ?Labs: ordered. ?Radiology: ordered. ? ? ?This patient presents to the ED for concern of chest pain and shortness of breath, this involves an  extensive number of treatment options, and is a complaint that carries with it a high risk of complications and morbidity.  The differential diagnosis includes but not limited to PE, ACS, costochondritis, GERD ? ? ?Co morbidities that complicate the patient evaluation ? ?No significant past medical history ? ? ?Additional history obtained: ? ?External records from outside source obtained and reviewed including recent visit to PCP office on 04/05/2021 for vitamin D deficiency. ? ? ?Lab Tests: ? ?I Ordered, and personally interpreted labs.  The pertinent results include: D-dimer is negative, doubt PE.  Troponin is less than 2, doubt ACS.  CBC and BMP without significant findings.  hCG negative. ? ? ?Imaging Studies ordered: ? ?I ordered imaging studies including chest x-ray ?I independently visualized and interpreted imaging which showed no acute abnormality ?I agree with the radiologist interpretation ? ? ?Cardiac Monitoring: / EKG: ? ?The patient was maintained on a cardiac monitor.  I personally viewed and interpreted the cardiac monitored which showed an underlying rhythm of: Sinus rhythm ?EKG without acute ischemic changes. ? ?Problem List / ED Course / Critical interventions / Medication management ? ?31 year old female with complaint of midsternal chest pressure which occurs only with laughing, movement, palpation of the chest and moving chin to chest.  Now associated with shortness of breath which she experiences more with exertion and conversation.  On exam, lungs clear to auscultation, heart regular rate and rhythm, pain is reproduced with palpation of the chest.  No lower extremity edema or calf tenderness. ?Reevaluation of the patient after these medicines showed that the patient resolved ?I have reviewed the patients home medicines and have made adjustments as needed ?Recommend recheck with primary care provider.  We will treat chest wall pain with 10-day course of anti-inflammatory medication with return  to ER precautions. ? ? ? ? ? ? ? ?Final Clinical Impression(s) / ED Diagnoses ?Final diagnoses:  ?Chest wall pain  ? ? ?Rx / DC Orders ?ED Discharge Orders   ? ?      Ordered  ?  naproxen (NAPROSYN) 500 MG tablet  2 times daily       ? 04/24/21 1222  ? ?  ?  ? ?  ? ? ?  ?Tacy Learn, PA-C ?04/24/21 1241 ? ?  ?Gareth Morgan, MD ?04/26/21 2256 ? ?

## 2021-04-24 NOTE — ED Notes (Signed)
First contact with patient. Pt arrived via triage from home with c/o CP pain and shob. Pt is A&OX 4, resp. even/unlabored. Pt placed into gown and on cardiac monitor, call light within reach. Patient updated on plan of care. Will continue to monitor patient.   ?

## 2022-08-30 IMAGING — DX DG CHEST 2V
2 series · 2 of 2 positions shown · non-contrast
Comparison: No priors.

CLINICAL DATA: 30-year-old female with history of chest pain,
heaviness and shortness of breath.

EXAM:
CHEST - 2 VIEW

[chest pa]
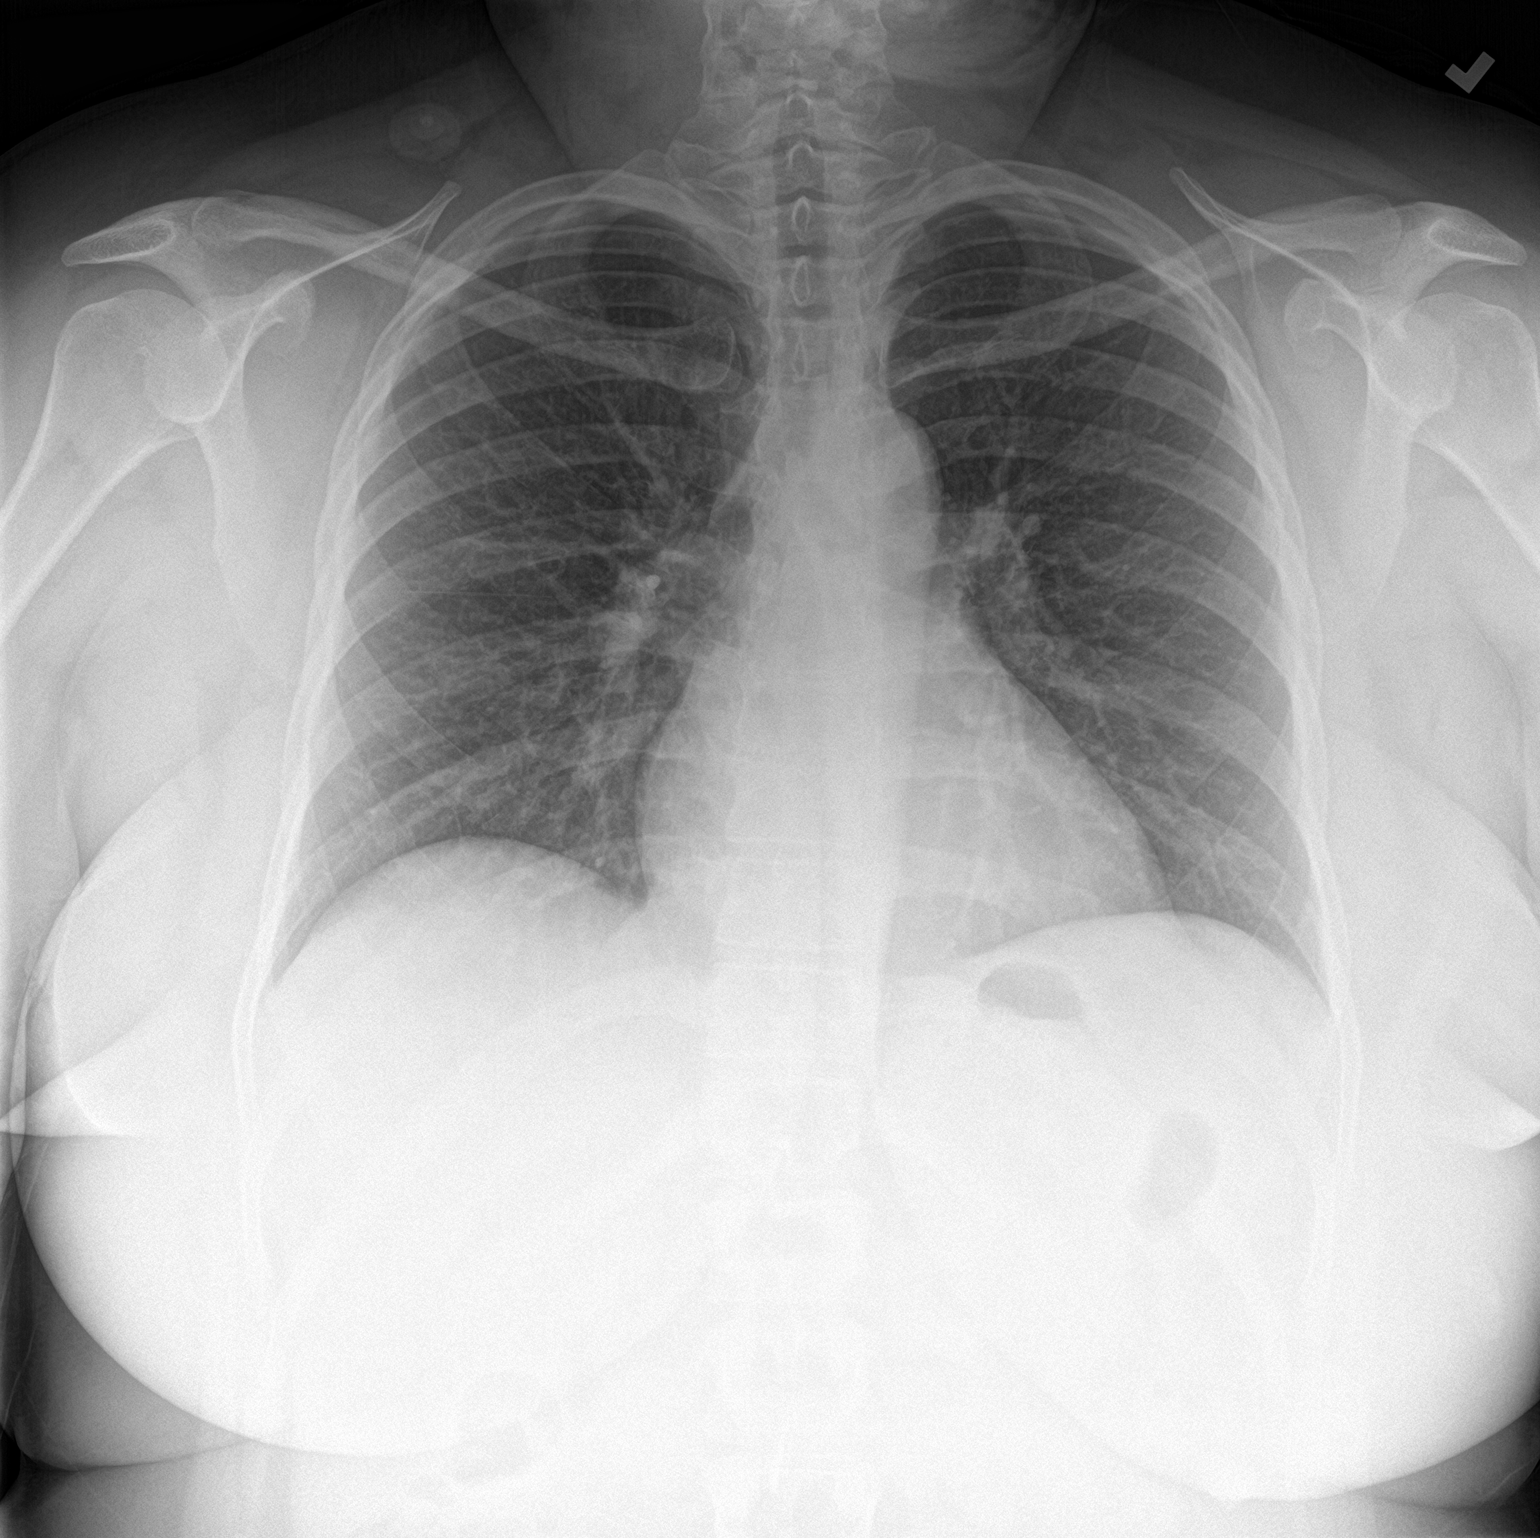

[chest lat]
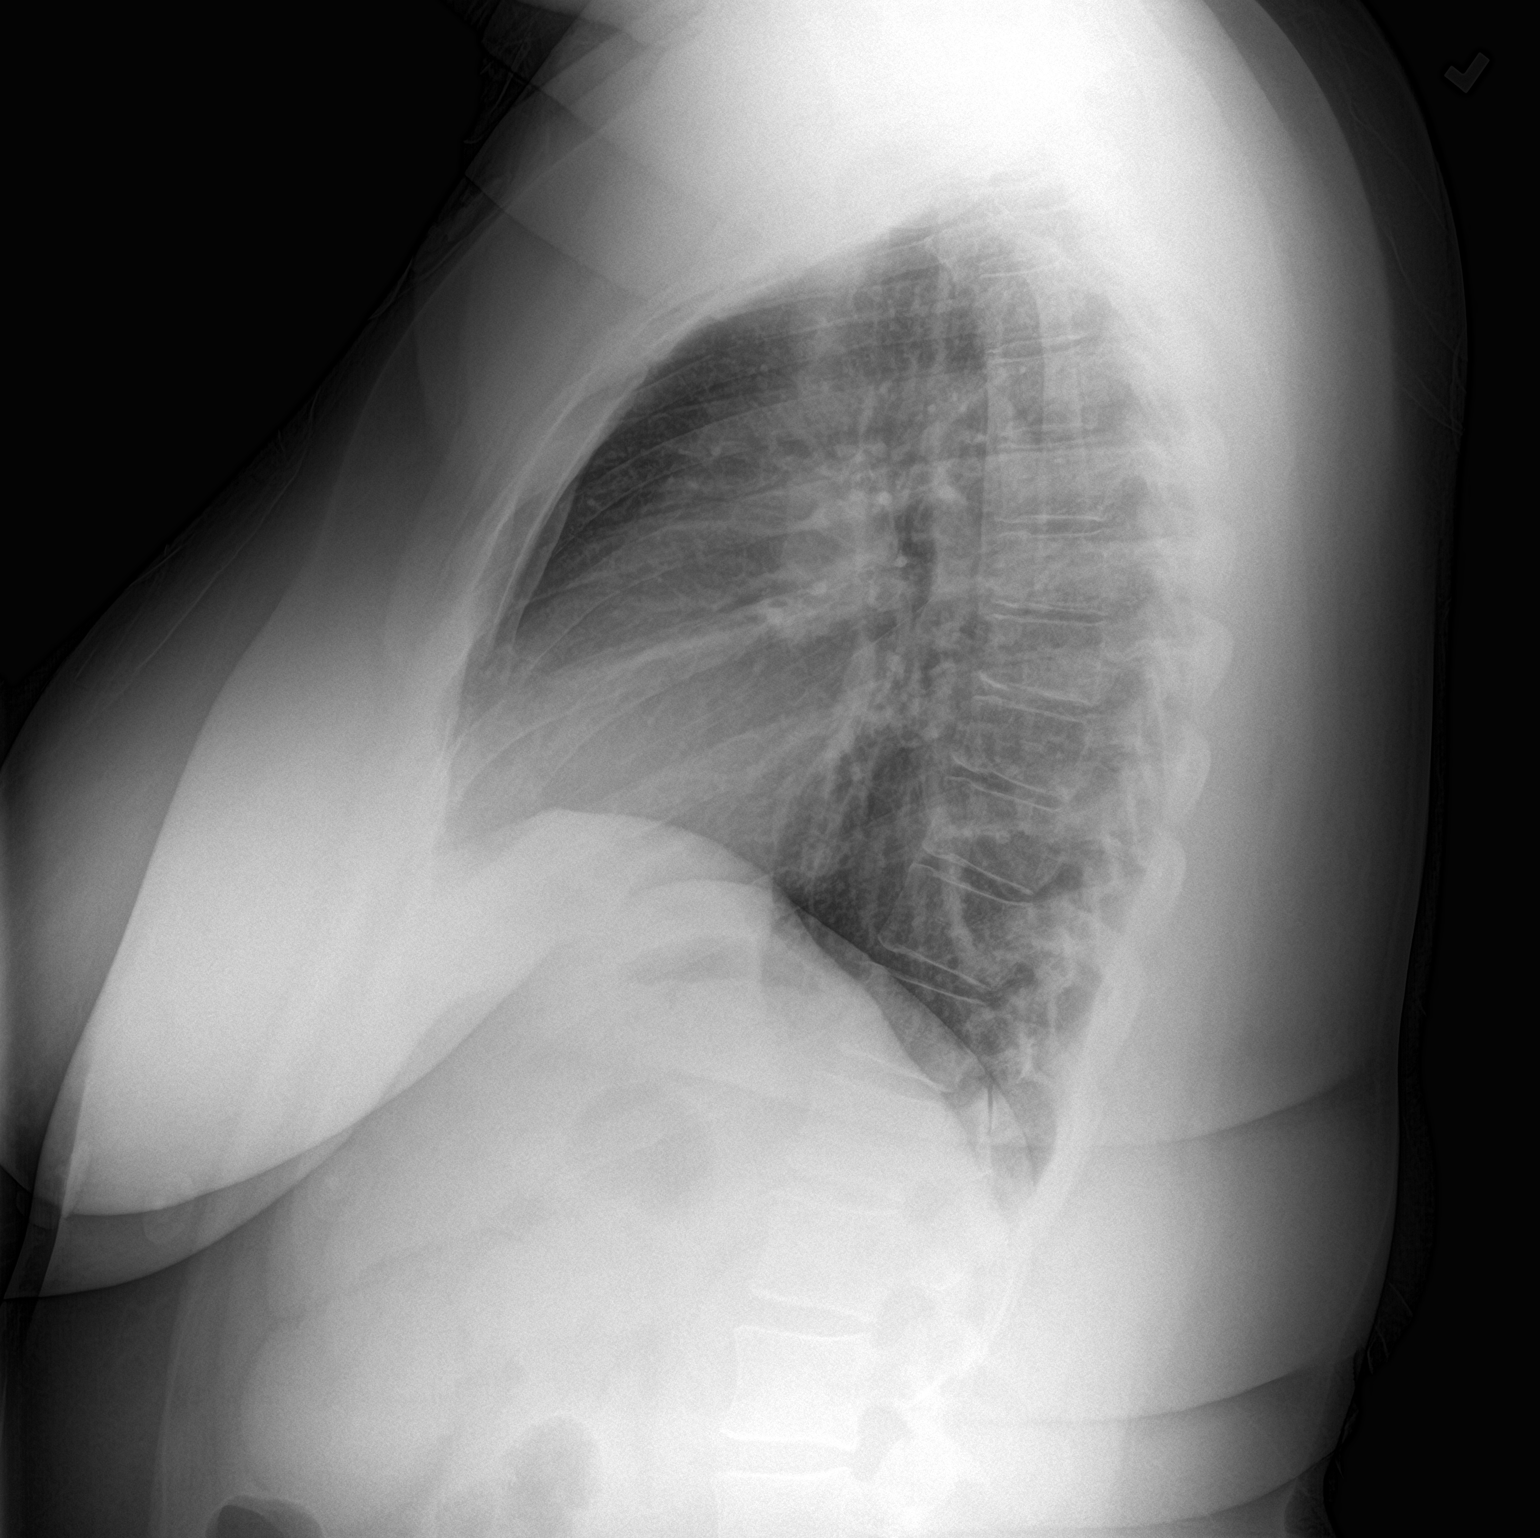

[2 of 2 positions shown; findings below may reference images not displayed]

FINDINGS: Lung volumes are normal. No consolidative airspace disease. No
pleural effusions. No pneumothorax. No pulmonary nodule or mass
noted. Pulmonary vasculature and the cardiomediastinal silhouette
are within normal limits.
IMPRESSION: No radiographic evidence of acute cardiopulmonary disease.

## 2024-01-28 ENCOUNTER — Other Ambulatory Visit: Payer: Self-pay

## 2024-01-28 ENCOUNTER — Encounter (HOSPITAL_BASED_OUTPATIENT_CLINIC_OR_DEPARTMENT_OTHER): Payer: Self-pay

## 2024-01-28 ENCOUNTER — Emergency Department (HOSPITAL_BASED_OUTPATIENT_CLINIC_OR_DEPARTMENT_OTHER)
Admission: EM | Admit: 2024-01-28 | Discharge: 2024-01-28 | Disposition: A | Discharge status: Home / Self Care | Attending: Emergency Medicine | Admitting: Emergency Medicine

## 2024-01-28 DIAGNOSIS — K625 Hemorrhage of anus and rectum: Secondary | ICD-10-CM | POA: Diagnosis present

## 2024-01-28 LAB — CBC
HCT: 37.6 % (ref 36.0–46.0)
Hemoglobin: 13 g/dL (ref 12.0–15.0)
MCH: 33.2 pg (ref 26.0–34.0)
MCHC: 34.6 g/dL (ref 30.0–36.0)
MCV: 95.9 fL (ref 80.0–100.0)
Platelets: 267 K/uL (ref 150–400)
RBC: 3.92 MIL/uL (ref 3.87–5.11)
RDW: 11.9 % (ref 11.5–15.5)
WBC: 6.9 K/uL (ref 4.0–10.5)
nRBC: 0 % (ref 0.0–0.2)

## 2024-01-28 LAB — COMPREHENSIVE METABOLIC PANEL WITH GFR
ALT: 9 U/L (ref 0–44)
AST: 18 U/L (ref 15–41)
Albumin: 4.5 g/dL (ref 3.5–5.0)
Alkaline Phosphatase: 71 U/L (ref 38–126)
Anion gap: 11 (ref 5–15)
BUN: 16 mg/dL (ref 6–20)
CO2: 23 mmol/L (ref 22–32)
Calcium: 9.5 mg/dL (ref 8.9–10.3)
Chloride: 106 mmol/L (ref 98–111)
Creatinine, Ser: 0.8 mg/dL (ref 0.44–1.00)
GFR, Estimated: >60 mL/min
Glucose, Bld: 83 mg/dL (ref 70–99)
Potassium: 4.4 mmol/L (ref 3.5–5.1)
Sodium: 140 mmol/L (ref 135–145)
Total Bilirubin: 0.3 mg/dL (ref 0.0–1.2)
Total Protein: 7.6 g/dL (ref 6.5–8.1)

## 2024-01-28 LAB — URINALYSIS, ROUTINE W REFLEX MICROSCOPIC
Bilirubin Urine: NEGATIVE
Glucose, UA: NEGATIVE mg/dL
Ketones, ur: NEGATIVE mg/dL
Leukocytes,Ua: NEGATIVE
Nitrite: NEGATIVE
Protein, ur: NEGATIVE mg/dL
Specific Gravity, Urine: >=1.03 (ref 1.005–1.030)
pH: 5.5 (ref 5.0–8.0)

## 2024-01-28 LAB — URINALYSIS, MICROSCOPIC (REFLEX)

## 2024-01-28 LAB — PREGNANCY, URINE: Preg Test, Ur: NEGATIVE

## 2024-01-28 LAB — LIPASE, BLOOD: Lipase: 40 U/L (ref 11–51)

## 2024-01-28 LAB — OCCULT BLOOD X 1 CARD TO LAB, STOOL: Fecal Occult Bld: POSITIVE — AB

## 2024-01-28 NOTE — ED Notes (Signed)
 Discharge paperwork reviewed entirely with patient, including follow up care. Pain was under control. No prescriptions were called in, but all questions were addressed.  Pt verbalized understanding as well as all parties involved. No questions or concerns voiced at the time of discharge. No acute distress noted. Pt was encouraged to stay adequately hydrated and eat a healthy diet.   Pt ambulated out to PVA without incident or assistance.  Pt advised they will notify their PCP immediately. and Pt advised they will seek followup care with a specialist and followup with their PCP.   The pt was instructed to set up and/or review MyChart for their results; and was informed their Providers all have access to the information as well.

## 2024-01-28 NOTE — ED Provider Notes (Signed)
 " [facility] Provider Note   CSN: [id number] Arrival date & time: 01/28/24  1750     Patient presents with: Rectal Bleeding   Kelli Foster is a 33 y.o. female.   HPI Patient is red blood after a bowel movement today.  She reports it was quite a bit.  She is not have any rectal pain.  No abdominal pain.  She reports it happened last week once or twice.  She became concerned when she saw blood in the toilet.  She has a uncle who had colon cancer but she is not sure how old he was when he developed it.  No fevers no chills no diarrhea.    Prior to Admission medications  Not on File    Allergies: Patient has no known allergies.    Review of Systems  Updated Vital Signs BP 139/83 (BP Location: Right Arm)   Pulse 78   Temp 98.3 F (36.8 C) (Oral)   Resp 20   Ht 5' 6 (1.676 m)   Wt 103.4 kg   LMP  (LMP Unknown)   SpO2 99%   BMI 36.80 kg/m   Physical Exam Constitutional:      Comments: Alert nontoxic well in appearance.  HENT:     Mouth/Throat:     Pharynx: Oropharynx is clear.  Cardiovascular:     Rate and Rhythm: Normal rate and regular rhythm.  Pulmonary:     Effort: Pulmonary effort is normal.     Breath sounds: Normal breath sounds.  Abdominal:     General: There is no distension.     Palpations: Abdomen is soft.     Tenderness: There is no abdominal tenderness. There is no guarding.  Genitourinary:    Comments: Normal rectal exam.  No stool in the vault.  No visible blood. Musculoskeletal:        General: Normal range of motion.     Right lower leg: No edema.     Left lower leg: No edema.  Skin:    General: Skin is warm and dry.  Neurological:     General: No focal deficit present.     Mental Status: She is oriented to person, place, and time.  Psychiatric:        Mood and Affect: Mood normal.     (all labs ordered are listed, but only abnormal results are displayed) Labs Reviewed  URINALYSIS,  ROUTINE W REFLEX MICROSCOPIC - Abnormal; Notable for the following components:      Result Value   Hgb urine dipstick SMALL (*)    All other components within normal limits  URINALYSIS, MICROSCOPIC (REFLEX) - Abnormal; Notable for the following components:   Bacteria, UA RARE (*)    All other components within normal limits  OCCULT BLOOD X 1 CARD TO LAB, STOOL - Abnormal; Notable for the following components:   Fecal Occult Bld POSITIVE (*)    All other components within normal limits  LIPASE, BLOOD  COMPREHENSIVE METABOLIC PANEL WITH GFR  CBC  PREGNANCY, URINE    EKG: None  Radiology: No results found.   Procedures   Medications Ordered in the ED - No data to display                                  Medical Decision Making Amount and/or Complexity of Data Reviewed Labs: ordered.  Presents as outlined.  No  history of intra-abdominal process or rectal disorder.  She has seen some blood with bowel movement.  She reports the blood was separate from the bowel movement and the bowel movement itself is normal in appearance.  Patient does not have rectal pain.  Abdomen is soft and nontender.  CBC normal with H&H of 13 and 37.  Comprehensive metabolic panel normal.  Urinalysis is normal.  Occult blood stool card positive  Bleeding is most likely secondary to internal hemorrhoids.  However, patient will require follow-up for rectal bleeding and likely referral to GI.  Patient is aware of this necessity and plan.  She reports she does have an uncle who had colon cancer although she is not sure at what age that was diagnosed.  She intends to follow-up. Patient is well in appearance with uncomplicated rectal bleeding.  Stable for outpatient management.     Final diagnoses:  Rectal bleeding    ED Discharge Orders     None          Armenta Canning, MD 01/29/24 1843  "

## 2024-01-28 NOTE — ED Triage Notes (Signed)
 Pt reports she noticed blood in the toilet after a bowel movement. She reports feeling fatigued and dizzy. Denies abdominal or chest pain, denies any pain at all.
